# Patient Record
Sex: Female | Born: 1965 | Race: Black or African American | Hispanic: No | Marital: Single | Smoking: Current every day smoker
Health system: Southern US, Community
[De-identification: ages and names within clinical notes are randomized; demographics above are authoritative.]

## PROBLEM LIST (undated history)

## (undated) DIAGNOSIS — R519 Headache, unspecified: Secondary | ICD-10-CM

## (undated) DIAGNOSIS — K5080 Crohn's disease of both small and large intestine without complications: Secondary | ICD-10-CM

## (undated) DIAGNOSIS — K219 Gastro-esophageal reflux disease without esophagitis: Secondary | ICD-10-CM

## (undated) DIAGNOSIS — R51 Headache: Secondary | ICD-10-CM

## (undated) DIAGNOSIS — G8929 Other chronic pain: Secondary | ICD-10-CM

## (undated) DIAGNOSIS — M1990 Unspecified osteoarthritis, unspecified site: Secondary | ICD-10-CM

## (undated) HISTORY — DX: Gastro-esophageal reflux disease without esophagitis: K21.9

## (undated) HISTORY — PX: KNEE SURGERY: SHX244

## (undated) HISTORY — PX: TUBAL LIGATION: SHX77

## (undated) HISTORY — PX: HAND SURGERY: SHX662

## (undated) HISTORY — DX: Unspecified osteoarthritis, unspecified site: M19.90

## (undated) HISTORY — DX: Headache, unspecified: R51.9

## (undated) HISTORY — DX: Headache: R51

## (undated) HISTORY — DX: Crohn's disease of both small and large intestine without complications: K50.80

## (undated) HISTORY — DX: Other chronic pain: G89.29

---

## 1998-01-19 ENCOUNTER — Encounter: Admission: RE | Admit: 1998-01-19 | Discharge: 1998-01-19 | Payer: Self-pay | Admitting: Sports Medicine

## 1999-08-27 ENCOUNTER — Encounter: Admission: RE | Admit: 1999-08-27 | Discharge: 1999-08-27 | Payer: Self-pay | Admitting: Family Medicine

## 1999-09-03 ENCOUNTER — Encounter: Admission: RE | Admit: 1999-09-03 | Discharge: 1999-10-02 | Payer: Self-pay | Admitting: *Deleted

## 2001-03-03 ENCOUNTER — Emergency Department (HOSPITAL_COMMUNITY): Admission: EM | Admit: 2001-03-03 | Discharge: 2001-03-03 | Payer: Self-pay | Admitting: Emergency Medicine

## 2001-03-03 ENCOUNTER — Encounter: Payer: Self-pay | Admitting: Emergency Medicine

## 2001-03-11 ENCOUNTER — Ambulatory Visit (HOSPITAL_BASED_OUTPATIENT_CLINIC_OR_DEPARTMENT_OTHER): Admission: RE | Admit: 2001-03-11 | Discharge: 2001-03-11 | Payer: Self-pay | Admitting: Orthopedic Surgery

## 2001-09-28 ENCOUNTER — Emergency Department (HOSPITAL_COMMUNITY): Admission: EM | Admit: 2001-09-28 | Discharge: 2001-09-28 | Payer: Self-pay

## 2002-05-11 ENCOUNTER — Encounter: Admission: RE | Admit: 2002-05-11 | Discharge: 2002-05-11 | Payer: Self-pay | Admitting: *Deleted

## 2002-05-12 ENCOUNTER — Other Ambulatory Visit: Admission: RE | Admit: 2002-05-12 | Discharge: 2002-05-12 | Payer: Self-pay | Admitting: *Deleted

## 2002-05-25 ENCOUNTER — Encounter: Admission: RE | Admit: 2002-05-25 | Discharge: 2002-05-25 | Payer: Self-pay | Admitting: *Deleted

## 2002-06-08 ENCOUNTER — Encounter: Admission: RE | Admit: 2002-06-08 | Discharge: 2002-06-08 | Payer: Self-pay | Admitting: *Deleted

## 2005-06-22 ENCOUNTER — Encounter (INDEPENDENT_AMBULATORY_CARE_PROVIDER_SITE_OTHER): Payer: Self-pay | Admitting: *Deleted

## 2005-06-22 ENCOUNTER — Inpatient Hospital Stay (HOSPITAL_COMMUNITY): Admission: AD | Admit: 2005-06-22 | Discharge: 2005-06-22 | Payer: Self-pay | Admitting: Obstetrics and Gynecology

## 2005-06-24 ENCOUNTER — Emergency Department (HOSPITAL_COMMUNITY): Admission: EM | Admit: 2005-06-24 | Discharge: 2005-06-24 | Payer: Self-pay | Admitting: Emergency Medicine

## 2005-07-03 ENCOUNTER — Ambulatory Visit: Payer: Self-pay | Admitting: Internal Medicine

## 2005-07-12 ENCOUNTER — Ambulatory Visit: Payer: Self-pay | Admitting: Internal Medicine

## 2005-07-12 ENCOUNTER — Encounter (INDEPENDENT_AMBULATORY_CARE_PROVIDER_SITE_OTHER): Payer: Self-pay | Admitting: *Deleted

## 2005-07-12 HISTORY — PX: COLONOSCOPY: SHX5424

## 2005-07-19 ENCOUNTER — Ambulatory Visit: Payer: Self-pay | Admitting: Internal Medicine

## 2005-07-23 ENCOUNTER — Ambulatory Visit: Payer: Self-pay | Admitting: Internal Medicine

## 2005-09-04 ENCOUNTER — Ambulatory Visit: Payer: Self-pay | Admitting: Internal Medicine

## 2005-09-09 ENCOUNTER — Encounter: Payer: Self-pay | Admitting: Internal Medicine

## 2005-09-09 ENCOUNTER — Ambulatory Visit (HOSPITAL_COMMUNITY): Admission: RE | Admit: 2005-09-09 | Discharge: 2005-09-09 | Payer: Self-pay | Admitting: Internal Medicine

## 2005-09-17 ENCOUNTER — Ambulatory Visit: Payer: Self-pay | Admitting: Internal Medicine

## 2005-10-01 ENCOUNTER — Ambulatory Visit: Payer: Self-pay | Admitting: Internal Medicine

## 2005-10-31 ENCOUNTER — Ambulatory Visit: Payer: Self-pay | Admitting: Internal Medicine

## 2005-12-02 ENCOUNTER — Ambulatory Visit (HOSPITAL_COMMUNITY): Admission: RE | Admit: 2005-12-02 | Discharge: 2005-12-02 | Payer: Self-pay | Admitting: Internal Medicine

## 2005-12-02 ENCOUNTER — Encounter: Payer: Self-pay | Admitting: Internal Medicine

## 2005-12-10 ENCOUNTER — Ambulatory Visit: Payer: Self-pay | Admitting: Internal Medicine

## 2005-12-31 ENCOUNTER — Ambulatory Visit: Payer: Self-pay | Admitting: Internal Medicine

## 2006-01-02 ENCOUNTER — Ambulatory Visit (HOSPITAL_COMMUNITY): Admission: RE | Admit: 2006-01-02 | Discharge: 2006-01-02 | Payer: Self-pay | Admitting: Internal Medicine

## 2006-01-28 ENCOUNTER — Ambulatory Visit: Payer: Self-pay | Admitting: Internal Medicine

## 2006-03-11 ENCOUNTER — Ambulatory Visit: Payer: Self-pay | Admitting: Internal Medicine

## 2006-03-28 ENCOUNTER — Ambulatory Visit: Payer: Self-pay | Admitting: Internal Medicine

## 2006-07-08 DIAGNOSIS — M1990 Unspecified osteoarthritis, unspecified site: Secondary | ICD-10-CM | POA: Insufficient documentation

## 2006-08-04 ENCOUNTER — Ambulatory Visit: Payer: Self-pay | Admitting: Internal Medicine

## 2006-09-29 ENCOUNTER — Ambulatory Visit: Payer: Self-pay | Admitting: Internal Medicine

## 2006-10-06 ENCOUNTER — Ambulatory Visit: Payer: Self-pay | Admitting: Internal Medicine

## 2006-10-06 LAB — CONVERTED CEMR LAB
Albumin: 3.6 g/dL (ref 3.5–5.2)
Amylase: 154 units/L — ABNORMAL HIGH (ref 27–131)
Basophils Absolute: 0 10*3/uL (ref 0.0–0.1)
Bilirubin, Direct: 0.1 mg/dL (ref 0.0–0.3)
Eosinophils Absolute: 0.1 10*3/uL (ref 0.0–0.6)
HCT: 38.7 % (ref 36.0–46.0)
Hemoglobin: 13.4 g/dL (ref 12.0–15.0)
Lipase: 43 units/L (ref 11.0–59.0)
MCHC: 34.8 g/dL (ref 30.0–36.0)
MCV: 95.5 fL (ref 78.0–100.0)
Monocytes Absolute: 0.6 10*3/uL (ref 0.2–0.7)
Monocytes Relative: 8.5 % (ref 3.0–11.0)
Neutrophils Relative %: 50.8 % (ref 43.0–77.0)
RBC: 4.05 M/uL (ref 3.87–5.11)

## 2006-10-20 ENCOUNTER — Ambulatory Visit: Payer: Self-pay | Admitting: Internal Medicine

## 2006-10-20 LAB — CONVERTED CEMR LAB
ALT: 14 units/L (ref 0–40)
AST: 20 units/L (ref 0–37)
Basophils Absolute: 0 10*3/uL (ref 0.0–0.1)
Basophils Relative: 0.2 % (ref 0.0–1.0)
Bilirubin, Direct: 0.1 mg/dL (ref 0.0–0.3)
Eosinophils Relative: 0.9 % (ref 0.0–5.0)
HCT: 40 % (ref 36.0–46.0)
Neutrophils Relative %: 52.1 % (ref 43.0–77.0)
RBC: 4.15 M/uL (ref 3.87–5.11)
RDW: 13 % (ref 11.5–14.6)
Total Bilirubin: 0.6 mg/dL (ref 0.3–1.2)
Total Protein: 7.2 g/dL (ref 6.0–8.3)
WBC: 5.2 10*3/uL (ref 4.5–10.5)

## 2006-12-03 ENCOUNTER — Ambulatory Visit: Payer: Self-pay | Admitting: Internal Medicine

## 2006-12-03 LAB — CONVERTED CEMR LAB
Albumin: 3.9 g/dL (ref 3.5–5.2)
Alkaline Phosphatase: 44 units/L (ref 39–117)
Basophils Relative: 0.5 % (ref 0.0–1.0)
Lymphocytes Relative: 44.3 % (ref 12.0–46.0)
Monocytes Relative: 6.3 % (ref 3.0–11.0)
Neutro Abs: 1.9 10*3/uL (ref 1.4–7.7)
Platelets: 154 10*3/uL (ref 150–400)
Total Bilirubin: 0.7 mg/dL (ref 0.3–1.2)

## 2007-05-05 ENCOUNTER — Ambulatory Visit: Payer: Self-pay | Admitting: Internal Medicine

## 2007-05-05 LAB — CONVERTED CEMR LAB
Basophils Absolute: 0 10*3/uL (ref 0.0–0.1)
Chloride: 105 meq/L (ref 96–112)
Creatinine, Ser: 0.8 mg/dL (ref 0.4–1.2)
Eosinophils Absolute: 0.1 10*3/uL (ref 0.0–0.6)
Glucose, Bld: 100 mg/dL — ABNORMAL HIGH (ref 70–99)
HCT: 38.8 % (ref 36.0–46.0)
Hemoglobin: 13.4 g/dL (ref 12.0–15.0)
MCHC: 34.5 g/dL (ref 30.0–36.0)
MCV: 96.2 fL (ref 78.0–100.0)
Monocytes Absolute: 0.4 10*3/uL (ref 0.2–0.7)
Neutro Abs: 2.2 10*3/uL (ref 1.4–7.7)
Neutrophils Relative %: 42.6 % — ABNORMAL LOW (ref 43.0–77.0)
RBC: 4.03 M/uL (ref 3.87–5.11)
Sodium: 140 meq/L (ref 135–145)

## 2007-05-06 ENCOUNTER — Ambulatory Visit: Payer: Self-pay | Admitting: Internal Medicine

## 2007-06-19 ENCOUNTER — Ambulatory Visit: Payer: Self-pay | Admitting: Internal Medicine

## 2007-06-19 LAB — CONVERTED CEMR LAB: Vit D, 1,25-Dihydroxy: 12 — ABNORMAL LOW (ref 30–89)

## 2007-06-26 ENCOUNTER — Ambulatory Visit: Payer: Self-pay | Admitting: Internal Medicine

## 2007-06-26 HISTORY — PX: OTHER SURGICAL HISTORY: SHX169

## 2007-08-03 ENCOUNTER — Ambulatory Visit: Payer: Self-pay | Admitting: Internal Medicine

## 2007-08-03 LAB — CONVERTED CEMR LAB
AST: 17 units/L (ref 0–37)
Alkaline Phosphatase: 46 units/L (ref 39–117)
Basophils Relative: 0.3 % (ref 0.0–1.0)
Eosinophils Relative: 0.3 % (ref 0.0–5.0)
Hemoglobin: 13.4 g/dL (ref 12.0–15.0)
Lymphocytes Relative: 42.9 % (ref 12.0–46.0)
Monocytes Absolute: 0.3 10*3/uL (ref 0.2–0.7)
Neutro Abs: 2.7 10*3/uL (ref 1.4–7.7)
Platelets: 181 10*3/uL (ref 150–400)
RDW: 13.5 % (ref 11.5–14.6)
Total Bilirubin: 0.7 mg/dL (ref 0.3–1.2)
Total Protein: 7.3 g/dL (ref 6.0–8.3)
WBC: 5.2 10*3/uL (ref 4.5–10.5)

## 2007-08-17 ENCOUNTER — Ambulatory Visit: Payer: Self-pay | Admitting: Internal Medicine

## 2007-08-17 DIAGNOSIS — K219 Gastro-esophageal reflux disease without esophagitis: Secondary | ICD-10-CM

## 2007-08-18 ENCOUNTER — Encounter: Payer: Self-pay | Admitting: Internal Medicine

## 2007-09-01 ENCOUNTER — Ambulatory Visit: Payer: Self-pay | Admitting: Internal Medicine

## 2007-09-01 LAB — CONVERTED CEMR LAB
ALT: 13 units/L (ref 0–35)
AST: 15 units/L (ref 0–37)
Albumin: 3.6 g/dL (ref 3.5–5.2)
Alkaline Phosphatase: 42 units/L (ref 39–117)
Basophils Absolute: 0 10*3/uL (ref 0.0–0.1)
Bilirubin, Direct: 0.1 mg/dL (ref 0.0–0.3)
Eosinophils Absolute: 0 10*3/uL (ref 0.0–0.6)
HCT: 37.5 % (ref 36.0–46.0)
MCHC: 33.2 g/dL (ref 30.0–36.0)
Neutrophils Relative %: 45 % (ref 43.0–77.0)
Platelets: 156 10*3/uL (ref 150–400)
RBC: 3.88 M/uL (ref 3.87–5.11)
RDW: 13 % (ref 11.5–14.6)

## 2007-10-12 ENCOUNTER — Ambulatory Visit: Payer: Self-pay | Admitting: Internal Medicine

## 2007-10-12 LAB — CONVERTED CEMR LAB
AST: 23 units/L (ref 0–37)
Albumin: 3.9 g/dL (ref 3.5–5.2)
Basophils Absolute: 0 10*3/uL (ref 0.0–0.1)
Bilirubin, Direct: 0.1 mg/dL (ref 0.0–0.3)
Eosinophils Absolute: 0.1 10*3/uL (ref 0.0–0.6)
HCT: 39.2 % (ref 36.0–46.0)
Hemoglobin: 12.9 g/dL (ref 12.0–15.0)
Lymphocytes Relative: 43.5 % (ref 12.0–46.0)
MCHC: 32.9 g/dL (ref 30.0–36.0)
MCV: 97.6 fL (ref 78.0–100.0)
Monocytes Absolute: 0.4 10*3/uL (ref 0.2–0.7)
Monocytes Relative: 7.4 % (ref 3.0–11.0)
Neutro Abs: 2.3 10*3/uL (ref 1.4–7.7)
Neutrophils Relative %: 46.8 % (ref 43.0–77.0)

## 2008-01-06 ENCOUNTER — Ambulatory Visit: Payer: Self-pay | Admitting: Internal Medicine

## 2008-01-06 LAB — CONVERTED CEMR LAB
AST: 21 units/L (ref 0–37)
Albumin: 4.2 g/dL (ref 3.5–5.2)
BUN: 10 mg/dL (ref 6–23)
Basophils Relative: 1.5 % — ABNORMAL HIGH (ref 0.0–1.0)
Calcium: 8.9 mg/dL (ref 8.4–10.5)
Chloride: 112 meq/L (ref 96–112)
Creatinine, Ser: 0.8 mg/dL (ref 0.4–1.2)
Eosinophils Absolute: 0 10*3/uL (ref 0.0–0.7)
Eosinophils Relative: 0.5 % (ref 0.0–5.0)
GFR calc non Af Amer: 85 mL/min
Glucose, Bld: 103 mg/dL — ABNORMAL HIGH (ref 70–99)
HCT: 39.2 % (ref 36.0–46.0)
MCV: 97.7 fL (ref 78.0–100.0)
Monocytes Absolute: 0.3 10*3/uL (ref 0.1–1.0)
Monocytes Relative: 6.5 % (ref 3.0–12.0)
Neutrophils Relative %: 58.6 % (ref 43.0–77.0)
Platelets: 159 10*3/uL (ref 150–400)
Potassium: 4.3 meq/L (ref 3.5–5.1)
RBC: 4.02 M/uL (ref 3.87–5.11)
WBC: 4.8 10*3/uL (ref 4.5–10.5)

## 2008-01-14 ENCOUNTER — Ambulatory Visit: Payer: Self-pay | Admitting: Internal Medicine

## 2008-02-02 ENCOUNTER — Encounter (INDEPENDENT_AMBULATORY_CARE_PROVIDER_SITE_OTHER): Payer: Self-pay | Admitting: *Deleted

## 2008-02-02 ENCOUNTER — Ambulatory Visit: Payer: Self-pay | Admitting: Internal Medicine

## 2008-02-02 LAB — CONVERTED CEMR LAB

## 2008-02-08 ENCOUNTER — Ambulatory Visit: Payer: Self-pay | Admitting: Internal Medicine

## 2008-02-08 LAB — CONVERTED CEMR LAB
Chlamydia, DNA Probe: NEGATIVE
GC Probe Amp, Genital: NEGATIVE

## 2008-02-09 LAB — CONVERTED CEMR LAB
AST: 20 units/L (ref 0–37)
Albumin: 4 g/dL (ref 3.5–5.2)
Alkaline Phosphatase: 42 units/L (ref 39–117)
Basophils Absolute: 0 10*3/uL (ref 0.0–0.1)
Bilirubin, Direct: 0.1 mg/dL (ref 0.0–0.3)
Eosinophils Absolute: 0 10*3/uL (ref 0.0–0.7)
Lymphocytes Relative: 50.4 % — ABNORMAL HIGH (ref 12.0–46.0)
MCHC: 33.9 g/dL (ref 30.0–36.0)
MCV: 96.9 fL (ref 78.0–100.0)
Neutrophils Relative %: 40.7 % — ABNORMAL LOW (ref 43.0–77.0)
Platelets: 172 10*3/uL (ref 150–400)
RDW: 13 % (ref 11.5–14.6)
Total Bilirubin: 0.7 mg/dL (ref 0.3–1.2)

## 2008-03-29 ENCOUNTER — Telehealth: Payer: Self-pay | Admitting: Internal Medicine

## 2008-05-09 ENCOUNTER — Ambulatory Visit: Payer: Self-pay | Admitting: Internal Medicine

## 2008-05-10 ENCOUNTER — Encounter: Payer: Self-pay | Admitting: Internal Medicine

## 2008-05-10 LAB — CONVERTED CEMR LAB
ALT: 12 units/L (ref 0–35)
Eosinophils Absolute: 0.1 10*3/uL (ref 0.0–0.7)
HCT: 37.6 % (ref 36.0–46.0)
Hemoglobin: 13 g/dL (ref 12.0–15.0)
MCHC: 34.6 g/dL (ref 30.0–36.0)
MCV: 98.5 fL (ref 78.0–100.0)
Monocytes Absolute: 0.2 10*3/uL (ref 0.1–1.0)
Monocytes Relative: 4 % (ref 3.0–12.0)
Neutro Abs: 3.5 10*3/uL (ref 1.4–7.7)
RDW: 13.2 % (ref 11.5–14.6)
Total Bilirubin: 0.6 mg/dL (ref 0.3–1.2)

## 2008-08-11 ENCOUNTER — Ambulatory Visit: Payer: Self-pay | Admitting: Internal Medicine

## 2008-08-11 LAB — CONVERTED CEMR LAB
AST: 25 units/L (ref 0–37)
Albumin: 4.2 g/dL (ref 3.5–5.2)
Alkaline Phosphatase: 51 units/L (ref 39–117)
Basophils Relative: 0.3 % (ref 0.0–3.0)
Eosinophils Relative: 0.8 % (ref 0.0–5.0)
Hemoglobin: 13.9 g/dL (ref 12.0–15.0)
Lymphocytes Relative: 37.4 % (ref 12.0–46.0)
MCV: 99.6 fL (ref 78.0–100.0)
Neutrophils Relative %: 52.6 % (ref 43.0–77.0)
RBC: 4.02 M/uL (ref 3.87–5.11)
Total Protein: 8.1 g/dL (ref 6.0–8.3)
WBC: 4 10*3/uL — ABNORMAL LOW (ref 4.5–10.5)

## 2008-08-17 ENCOUNTER — Telehealth (INDEPENDENT_AMBULATORY_CARE_PROVIDER_SITE_OTHER): Payer: Self-pay | Admitting: *Deleted

## 2008-08-19 ENCOUNTER — Telehealth: Payer: Self-pay | Admitting: Internal Medicine

## 2008-08-19 ENCOUNTER — Ambulatory Visit: Payer: Self-pay | Admitting: Internal Medicine

## 2008-08-19 ENCOUNTER — Encounter (INDEPENDENT_AMBULATORY_CARE_PROVIDER_SITE_OTHER): Payer: Self-pay | Admitting: *Deleted

## 2008-08-19 DIAGNOSIS — N920 Excessive and frequent menstruation with regular cycle: Secondary | ICD-10-CM

## 2008-08-19 LAB — CONVERTED CEMR LAB
Platelets: 181 10*3/uL (ref 150–400)
Preg, Serum: NEGATIVE
RDW: 13.9 % (ref 11.5–15.5)
TSH: 2.44 microintl units/mL (ref 0.350–4.50)

## 2008-08-23 ENCOUNTER — Ambulatory Visit (HOSPITAL_COMMUNITY): Admission: RE | Admit: 2008-08-23 | Discharge: 2008-08-23 | Payer: Self-pay | Admitting: *Deleted

## 2008-09-26 ENCOUNTER — Telehealth: Payer: Self-pay | Admitting: Internal Medicine

## 2008-10-18 ENCOUNTER — Ambulatory Visit: Payer: Self-pay | Admitting: Internal Medicine

## 2008-10-18 ENCOUNTER — Telehealth: Payer: Self-pay | Admitting: Internal Medicine

## 2008-10-18 LAB — CONVERTED CEMR LAB
Albumin: 4.2 g/dL (ref 3.5–5.2)
Alkaline Phosphatase: 56 units/L (ref 39–117)
CO2: 26 meq/L (ref 19–32)
Eosinophils Relative: 0.2 % (ref 0.0–5.0)
Glucose, Bld: 114 mg/dL — ABNORMAL HIGH (ref 70–99)
Monocytes Absolute: 0.5 10*3/uL (ref 0.1–1.0)
Monocytes Relative: 9.3 % (ref 3.0–12.0)
Neutrophils Relative %: 68.8 % (ref 43.0–77.0)
Platelets: 163 10*3/uL (ref 150.0–400.0)
Potassium: 4.6 meq/L (ref 3.5–5.1)
Sodium: 140 meq/L (ref 135–145)
Total Protein: 7.6 g/dL (ref 6.0–8.3)
WBC: 5.8 10*3/uL (ref 4.5–10.5)

## 2008-10-19 ENCOUNTER — Ambulatory Visit: Payer: Self-pay | Admitting: Internal Medicine

## 2008-10-20 ENCOUNTER — Encounter: Payer: Self-pay | Admitting: Nurse Practitioner

## 2008-11-16 ENCOUNTER — Telehealth: Payer: Self-pay | Admitting: Internal Medicine

## 2008-11-21 ENCOUNTER — Ambulatory Visit: Payer: Self-pay | Admitting: Internal Medicine

## 2008-11-28 ENCOUNTER — Telehealth: Payer: Self-pay | Admitting: Internal Medicine

## 2009-01-05 ENCOUNTER — Encounter (INDEPENDENT_AMBULATORY_CARE_PROVIDER_SITE_OTHER): Payer: Self-pay | Admitting: *Deleted

## 2009-03-02 ENCOUNTER — Ambulatory Visit: Payer: Self-pay | Admitting: Obstetrics & Gynecology

## 2009-03-02 ENCOUNTER — Ambulatory Visit: Payer: Self-pay | Admitting: Internal Medicine

## 2009-03-02 ENCOUNTER — Encounter (INDEPENDENT_AMBULATORY_CARE_PROVIDER_SITE_OTHER): Payer: Self-pay | Admitting: Obstetrics & Gynecology

## 2009-03-06 LAB — CONVERTED CEMR LAB
ALT: 13 units/L (ref 0–35)
Albumin: 4.2 g/dL (ref 3.5–5.2)
Basophils Relative: 0.7 % (ref 0.0–3.0)
Eosinophils Relative: 1.1 % (ref 0.0–5.0)
Lymphocytes Relative: 40.2 % (ref 12.0–46.0)
MCV: 97.5 fL (ref 78.0–100.0)
Monocytes Relative: 8.3 % (ref 3.0–12.0)
Neutrophils Relative %: 49.7 % (ref 43.0–77.0)
RBC: 4.02 M/uL (ref 3.87–5.11)
Total Bilirubin: 0.7 mg/dL (ref 0.3–1.2)
WBC: 4.3 10*3/uL — ABNORMAL LOW (ref 4.5–10.5)

## 2009-03-13 ENCOUNTER — Telehealth: Payer: Self-pay | Admitting: Internal Medicine

## 2009-06-05 ENCOUNTER — Telehealth: Payer: Self-pay | Admitting: Internal Medicine

## 2009-06-06 ENCOUNTER — Ambulatory Visit: Payer: Self-pay | Admitting: Internal Medicine

## 2009-06-08 LAB — CONVERTED CEMR LAB
ALT: 14 units/L (ref 0–35)
Albumin: 3.9 g/dL (ref 3.5–5.2)
Basophils Relative: 0.6 % (ref 0.0–3.0)
Eosinophils Absolute: 0 10*3/uL (ref 0.0–0.7)
Eosinophils Relative: 1.3 % (ref 0.0–5.0)
HCT: 36.1 % (ref 36.0–46.0)
Hemoglobin: 12.6 g/dL (ref 12.0–15.0)
MCHC: 35 g/dL (ref 30.0–36.0)
MCV: 101.7 fL — ABNORMAL HIGH (ref 78.0–100.0)
Monocytes Absolute: 0.2 10*3/uL (ref 0.1–1.0)
Neutro Abs: 2.1 10*3/uL (ref 1.4–7.7)
RBC: 3.55 M/uL — ABNORMAL LOW (ref 3.87–5.11)
Total Protein: 6.8 g/dL (ref 6.0–8.3)

## 2009-08-10 ENCOUNTER — Encounter: Payer: Self-pay | Admitting: Internal Medicine

## 2009-08-10 ENCOUNTER — Ambulatory Visit: Payer: Self-pay | Admitting: Internal Medicine

## 2009-08-21 ENCOUNTER — Ambulatory Visit: Payer: Self-pay | Admitting: Internal Medicine

## 2009-08-21 DIAGNOSIS — K5080 Crohn's disease of both small and large intestine without complications: Secondary | ICD-10-CM | POA: Insufficient documentation

## 2009-10-24 ENCOUNTER — Ambulatory Visit: Payer: Self-pay | Admitting: Internal Medicine

## 2009-11-09 ENCOUNTER — Ambulatory Visit: Payer: Self-pay | Admitting: Internal Medicine

## 2009-11-10 LAB — CONVERTED CEMR LAB
Basophils Absolute: 0 10*3/uL (ref 0.0–0.1)
Eosinophils Absolute: 0 10*3/uL (ref 0.0–0.7)
HCT: 40 % (ref 36.0–46.0)
Hemoglobin: 13.6 g/dL (ref 12.0–15.0)
Lymphs Abs: 2 10*3/uL (ref 0.7–4.0)
MCHC: 34.1 g/dL (ref 30.0–36.0)
Monocytes Absolute: 0.3 10*3/uL (ref 0.1–1.0)
Monocytes Relative: 7.1 % (ref 3.0–12.0)
Neutro Abs: 1.7 10*3/uL (ref 1.4–7.7)
Platelets: 147 10*3/uL — ABNORMAL LOW (ref 150.0–400.0)
RDW: 13.8 % (ref 11.5–14.6)

## 2009-12-22 ENCOUNTER — Encounter (INDEPENDENT_AMBULATORY_CARE_PROVIDER_SITE_OTHER): Payer: Self-pay | Admitting: *Deleted

## 2010-01-10 ENCOUNTER — Encounter (INDEPENDENT_AMBULATORY_CARE_PROVIDER_SITE_OTHER): Payer: Self-pay | Admitting: *Deleted

## 2010-01-29 LAB — CONVERTED CEMR LAB
AST: 18 units/L (ref 0–37)
Albumin: 3.7 g/dL (ref 3.5–5.2)
Alkaline Phosphatase: 42 units/L (ref 39–117)
Basophils Absolute: 0 10*3/uL (ref 0.0–0.1)
Basophils Relative: 1 % (ref 0.0–3.0)
Bilirubin, Direct: 0.1 mg/dL (ref 0.0–0.3)
Hemoglobin: 12.3 g/dL (ref 12.0–15.0)
Lymphocytes Relative: 34.3 % (ref 12.0–46.0)
Monocytes Relative: 7 % (ref 3.0–12.0)
Neutro Abs: 2.1 10*3/uL (ref 1.4–7.7)
Neutrophils Relative %: 57.3 % (ref 43.0–77.0)
RBC: 3.72 M/uL — ABNORMAL LOW (ref 3.87–5.11)
RDW: 12.9 % (ref 11.5–14.6)

## 2010-02-01 ENCOUNTER — Ambulatory Visit: Payer: Self-pay | Admitting: Internal Medicine

## 2010-02-01 DIAGNOSIS — R519 Headache, unspecified: Secondary | ICD-10-CM | POA: Insufficient documentation

## 2010-02-01 DIAGNOSIS — R51 Headache: Secondary | ICD-10-CM | POA: Insufficient documentation

## 2010-03-20 IMAGING — US US PELVIS COMPLETE MODIFY
1 series · 14 of 25 positions shown · non-contrast
Comparison: CT examination 06/22/2005.

CLINICAL DATA: History given of prolonged menstrual period.
History given of previous tubal ligation procedures.  History given
of Crohn's disease. The patient's last menstrual period was dated
08/03/2008.

TRANSABDOMINAL AND TRANSVAGINAL ULTRASOUND OF PELVIS
TECHNIQUE: Both transabdominal and transvaginal ultrasound
examinations of the pelvis were performed including evaluation of
the uterus, ovaries, adnexal regions, and pelvic cul-de-sac.

[Series 1: unknown · 0.23mm/px · 14 of 47 slices shown]
[im 1/47]
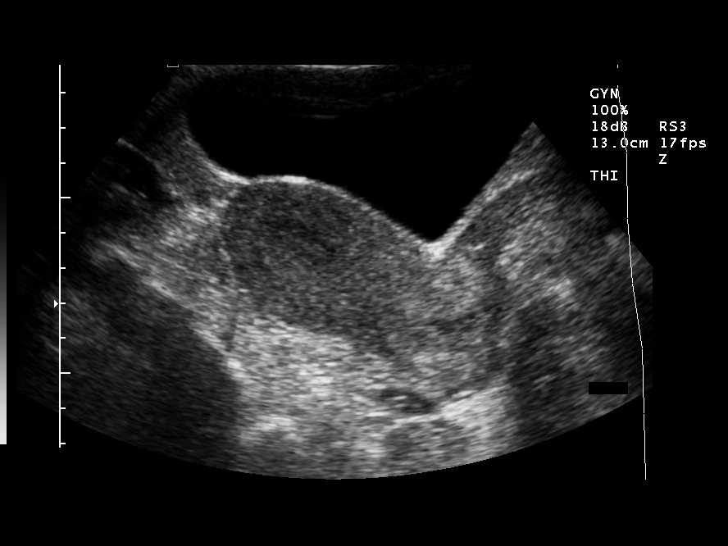
[im 4/47]
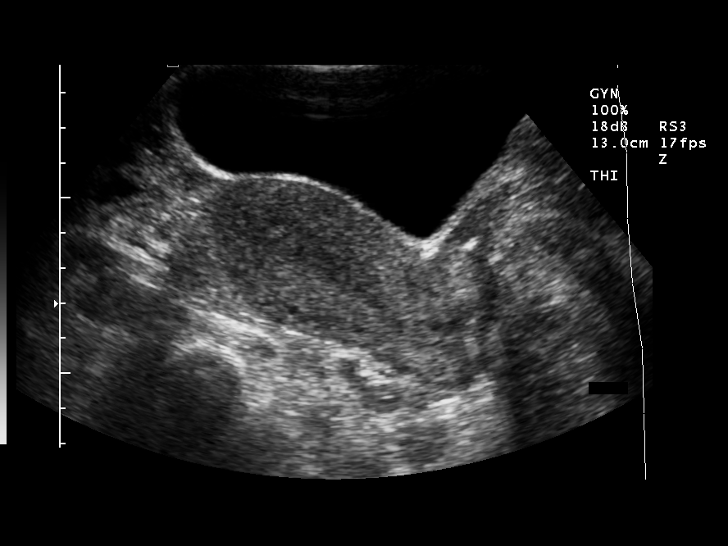
[im 8/47]
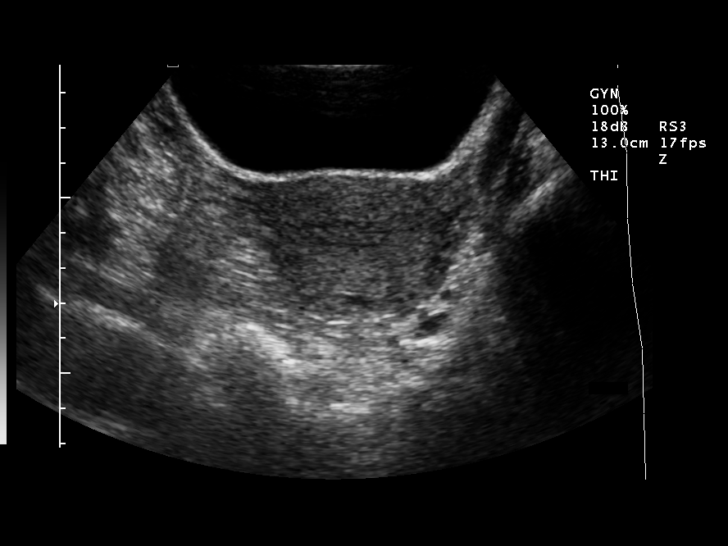
[im 12/47]
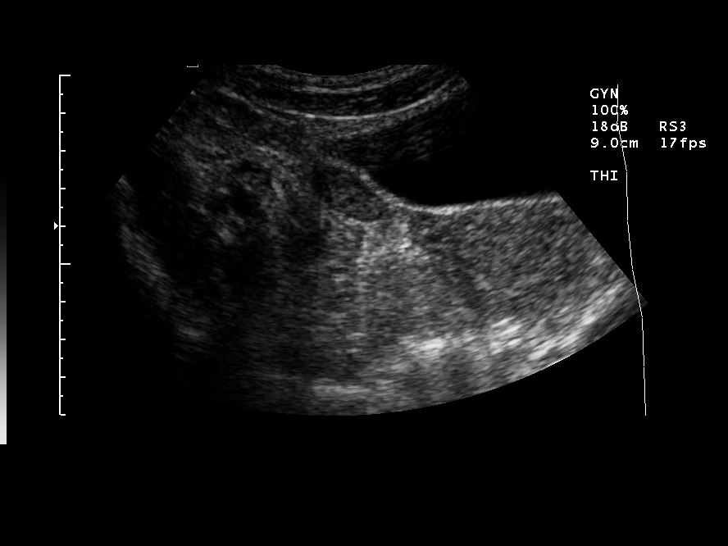
[im 16/47]
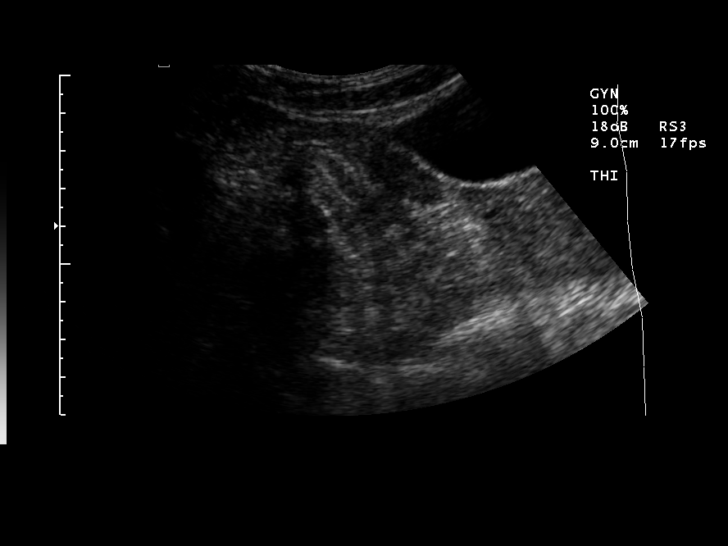
[im 18/47]
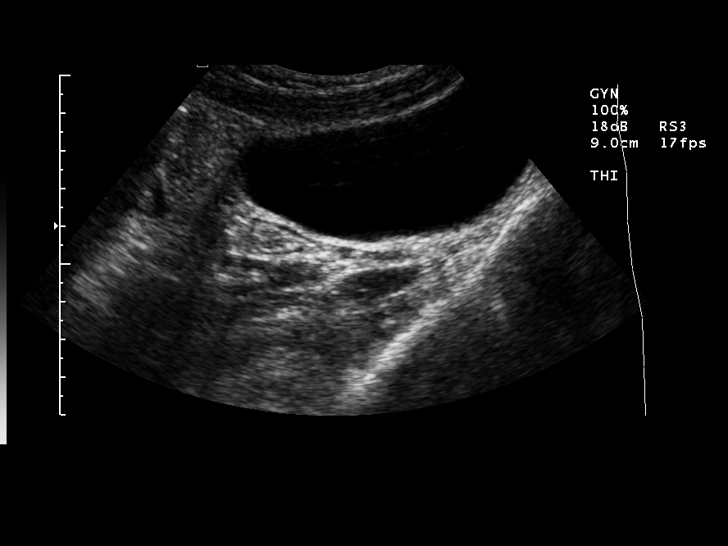
[im 22/47]
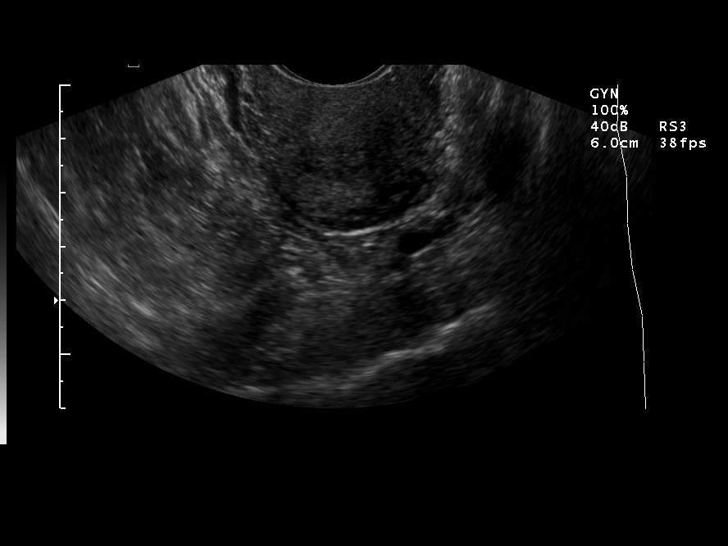
[im 25/47]
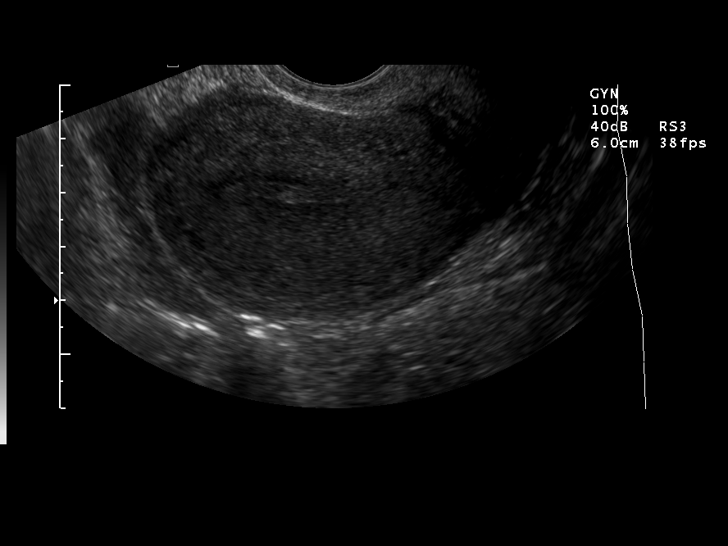
[im 29/47]
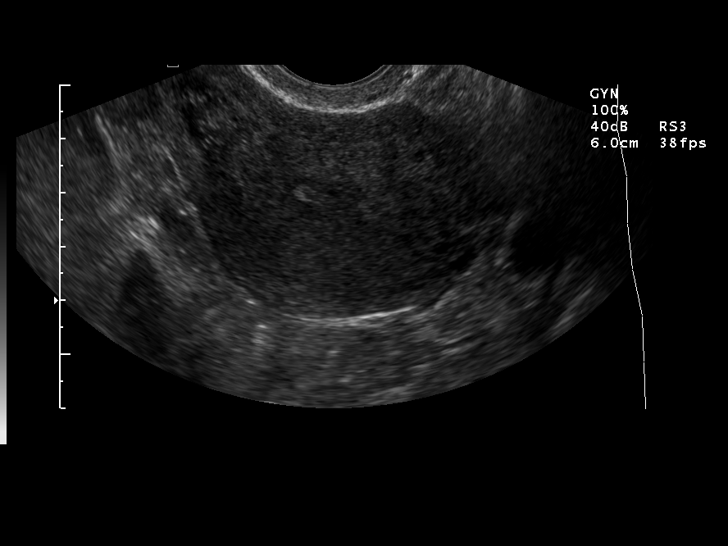
[im 31/47]
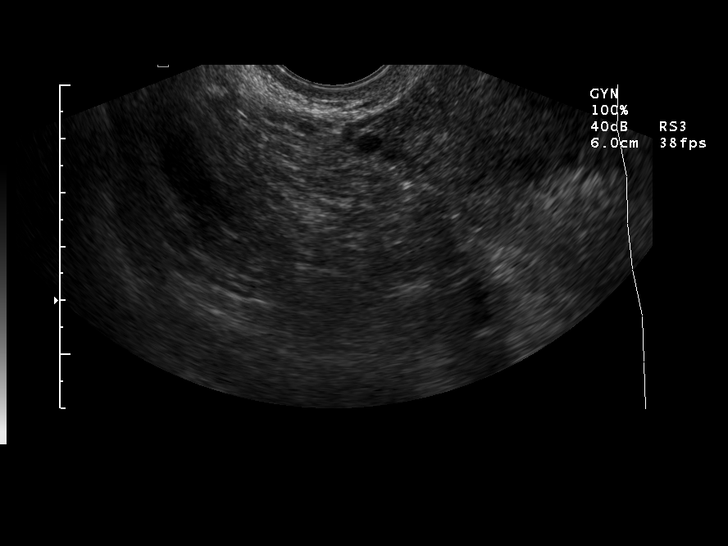
[im 35/47]
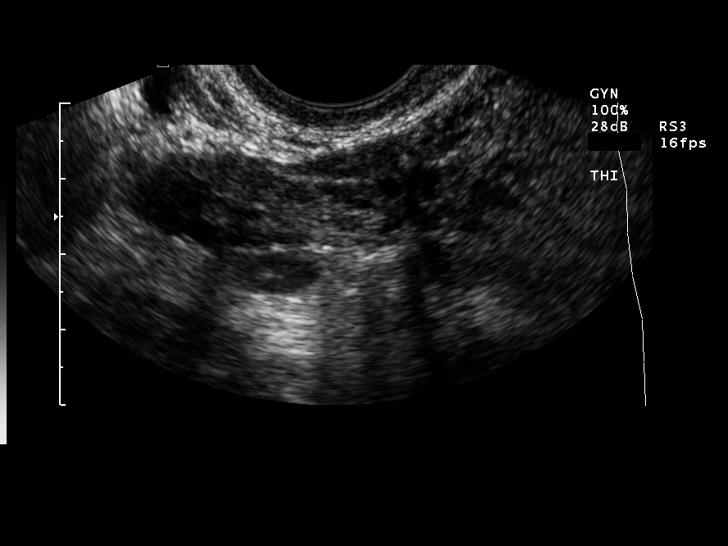
[im 39/47]
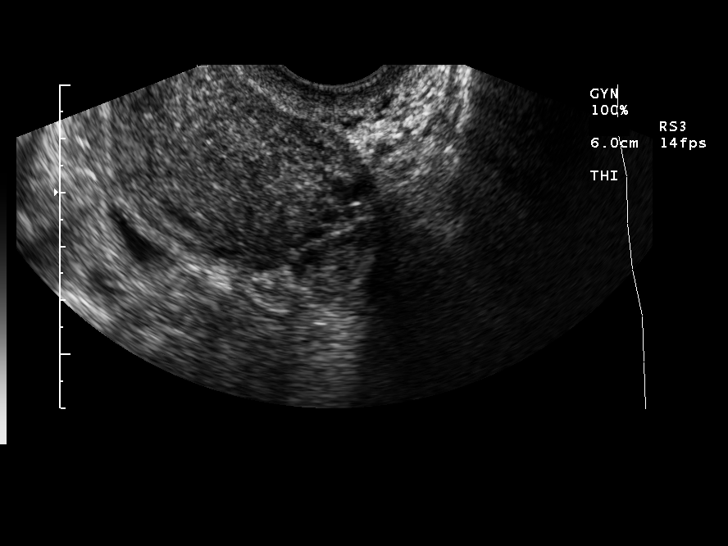
[im 43/47]
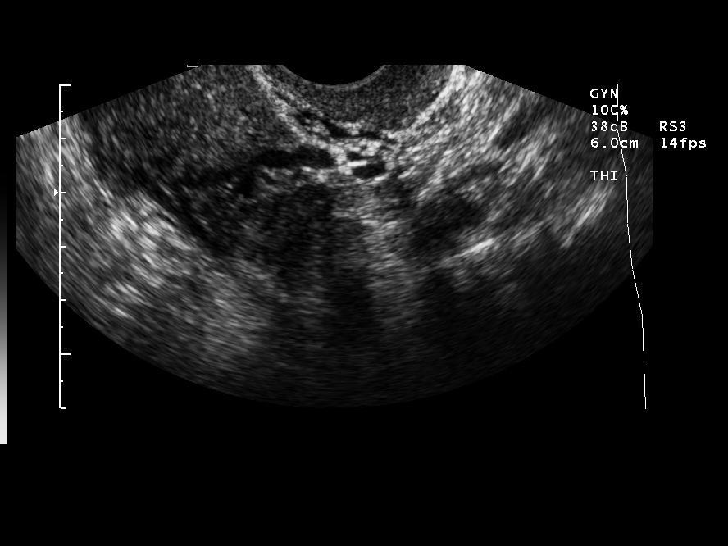
[im 47/47]
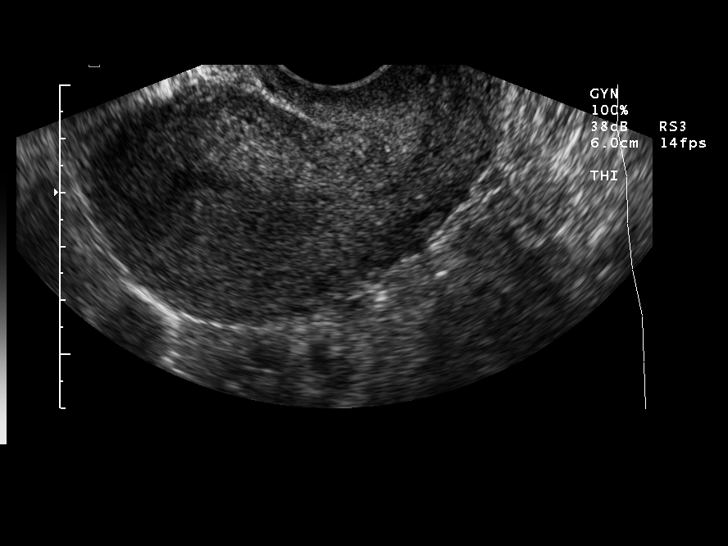

[14 of 25 positions shown; findings below may reference images not displayed]

FINDINGS: The uterus measures 9.0 x 3.9 x 5.5 cm.  Myometrium
appears normal.

Endometrial stripe thickness is 0.57 cm. No endometrial mass or
fluid collection is identified.

No free fluid is seen within the pelvis.

Right ovary measures 2.5 x 1.2 x 2.9 cm.

Left ovary measures 2.7 x 1.7 x 1.1 cm.

No ovarian lesions are identified.
IMPRESSION: No pelvic pathology is identified.

## 2010-05-09 LAB — CONVERTED CEMR LAB
AST: 19 units/L (ref 0–37)
Albumin: 4.1 g/dL (ref 3.5–5.2)
Basophils Absolute: 0 10*3/uL (ref 0.0–0.1)
Basophils Relative: 0.5 % (ref 0.0–3.0)
Eosinophils Absolute: 0 10*3/uL (ref 0.0–0.7)
Eosinophils Relative: 0.6 % (ref 0.0–5.0)
HCT: 36.8 % (ref 36.0–46.0)
Hemoglobin: 12.6 g/dL (ref 12.0–15.0)
MCV: 99.6 fL (ref 78.0–100.0)
Monocytes Relative: 5.6 % (ref 3.0–12.0)
Platelets: 148 10*3/uL — ABNORMAL LOW (ref 150.0–400.0)
RBC: 3.69 M/uL — ABNORMAL LOW (ref 3.87–5.11)
Total Bilirubin: 0.3 mg/dL (ref 0.3–1.2)
WBC: 5.4 10*3/uL (ref 4.5–10.5)

## 2010-05-10 ENCOUNTER — Ambulatory Visit: Payer: Self-pay | Admitting: Internal Medicine

## 2010-05-11 LAB — CONVERTED CEMR LAB
AST: 23 units/L (ref 0–37)
Alkaline Phosphatase: 53 units/L (ref 39–117)
Basophils Absolute: 0 10*3/uL (ref 0.0–0.1)
Basophils Relative: 0.4 % (ref 0.0–3.0)
Bilirubin, Direct: 0 mg/dL (ref 0.0–0.3)
Eosinophils Absolute: 0 10*3/uL (ref 0.0–0.7)
Hemoglobin: 12.6 g/dL (ref 12.0–15.0)
Lymphocytes Relative: 28.4 % (ref 12.0–46.0)
MCHC: 33.9 g/dL (ref 30.0–36.0)
Monocytes Relative: 5.7 % (ref 3.0–12.0)
Neutro Abs: 3.2 10*3/uL (ref 1.4–7.7)
Neutrophils Relative %: 64.6 % (ref 43.0–77.0)
RBC: 3.74 M/uL — ABNORMAL LOW (ref 3.87–5.11)
Total Protein: 6.7 g/dL (ref 6.0–8.3)

## 2010-06-18 ENCOUNTER — Ambulatory Visit (HOSPITAL_COMMUNITY): Admission: RE | Admit: 2010-06-18 | Discharge: 2010-06-18 | Payer: Self-pay | Admitting: Family Medicine

## 2010-06-18 ENCOUNTER — Encounter (INDEPENDENT_AMBULATORY_CARE_PROVIDER_SITE_OTHER): Payer: Self-pay | Admitting: *Deleted

## 2010-06-18 ENCOUNTER — Ambulatory Visit: Payer: Self-pay | Admitting: Obstetrics and Gynecology

## 2010-06-18 LAB — CONVERTED CEMR LAB
HCV Ab: NEGATIVE
HIV: NONREACTIVE

## 2010-07-02 ENCOUNTER — Encounter: Admission: RE | Admit: 2010-07-02 | Discharge: 2010-07-02 | Payer: Self-pay | Admitting: Family Medicine

## 2010-08-09 ENCOUNTER — Encounter: Payer: Self-pay | Admitting: Internal Medicine

## 2010-08-09 ENCOUNTER — Other Ambulatory Visit: Payer: Self-pay | Admitting: Internal Medicine

## 2010-08-09 ENCOUNTER — Ambulatory Visit
Admission: RE | Admit: 2010-08-09 | Discharge: 2010-08-09 | Payer: Self-pay | Source: Home / Self Care | Attending: Internal Medicine | Admitting: Internal Medicine

## 2010-08-09 LAB — CBC WITH DIFFERENTIAL/PLATELET
Basophils Absolute: 0 10*3/uL (ref 0.0–0.1)
Basophils Relative: 0.6 % (ref 0.0–3.0)
Eosinophils Absolute: 0.1 10*3/uL (ref 0.0–0.7)
Eosinophils Relative: 1.6 % (ref 0.0–5.0)
HCT: 40 % (ref 36.0–46.0)
Hemoglobin: 13.6 g/dL (ref 12.0–15.0)
Lymphocytes Relative: 32.5 % (ref 12.0–46.0)
Lymphs Abs: 1.5 10*3/uL (ref 0.7–4.0)
MCHC: 34 g/dL (ref 30.0–36.0)
MCV: 98.7 fl (ref 78.0–100.0)
Monocytes Absolute: 0.3 10*3/uL (ref 0.1–1.0)
Monocytes Relative: 6.7 % (ref 3.0–12.0)
Neutro Abs: 2.7 10*3/uL (ref 1.4–7.7)
Neutrophils Relative %: 58.6 % (ref 43.0–77.0)
Platelets: 164 10*3/uL (ref 150.0–400.0)
RBC: 4.05 Mil/uL (ref 3.87–5.11)
RDW: 14.3 % (ref 11.5–14.6)
WBC: 4.6 10*3/uL (ref 4.5–10.5)

## 2010-08-09 LAB — HEPATIC FUNCTION PANEL
ALT: 21 U/L (ref 0–35)
AST: 29 U/L (ref 0–37)
Albumin: 4.2 g/dL (ref 3.5–5.2)
Alkaline Phosphatase: 53 U/L (ref 39–117)
Bilirubin, Direct: 0.1 mg/dL (ref 0.0–0.3)
Total Bilirubin: 0.5 mg/dL (ref 0.3–1.2)
Total Protein: 7.6 g/dL (ref 6.0–8.3)

## 2010-09-04 NOTE — Assessment & Plan Note (Signed)
Summary: f/u--ch.   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 NO GI problems  Having headaches and using Advil 2-4/day sometimes. Off and on x 2-3 monhs. She feels stressed looking for a job and considers that part of the problem. Pain is bitemporal and more frequent than in past. Denies vision changes, focal weakness, neurologic changes.  No fevers, chills, skin rash. Keeping GYN appointments. Due in next 1-2 months [facility]).   GI Review of Systems      Denies abdominal pain, acid reflux, belching, bloating, chest pain, dysphagia with liquids, dysphagia with solids, heartburn, loss of appetite, nausea, vomiting, vomiting blood, weight loss, and  weight gain.        Denies anal fissure, black tarry stools, change in bowel habit, constipation, diarrhea, diverticulosis, fecal incontinence, heme positive stool, hemorrhoids, irritable bowel syndrome, jaundice, light color stool, liver problems, rectal bleeding, and  rectal pain.    Current Medications (verified): 1)  Omeprazole 20 Mg Cpdr (Omeprazole) .... Take 1 Tab Daily 2)  Azathioprine 50 Mg  Tabs (Azathioprine) .... Take 1 1/2 Tablets By Mouth Once Daily 3)  Vitamin D 1000 Unit  Tabs (Cholecalciferol) .Marland Kitchen.. 1 Once Daily  Allergies (verified): 1)  ! * Live Vaccines  Past History:  Past Medical History: Reviewed history from 08/19/2008 and no changes required. OSTEOARTHRITIS    - hx of injury in L knee with intermittent joint effusion CROHN'S DISEASE    - Ileitis, failed Asacol, on Azathioprine (Intermediate Metabolizer)    - dx'd in 2006    - followed by Dr. Leone Payor Hypovitaminosis D (treated) GERD (clinical dx)  Past Surgical History: Reviewed history from  08/19/2008 and no changes required. s/p tubal ligation Left Knee Surgery Right Hand Surgery  Family History: Reviewed history from 08/21/2009 and no changes required. No hx of IBD No family hx of breast or colon CA Mother: HTN 1 sister/2brothers: 1 brother with HTN No FH of Colon Cancer:  Social History: Reviewed history from 08/21/2009 and no changes required. Patient restarted smoking, counselled to quit. 3 ciggs per day Single, unemployed laid off 1/11 Two dgtrs. No current boyfriends. Medicaid Alcohol Use - yes-on occasion Daily Caffeine Use-5 cups daily Illicit Drug Use - no Patient gets regular exercise.  Vital Signs:  Patient profile:   45 year old female Height:      64 inches Weight:      110.25 pounds BMI:     18.99 Pulse rate:   72 / minute Pulse rhythm:   regular BP sitting:   110 / 60  (left arm)  Vitals Entered By: Milford Cage NCMA (February 01, 2010 8:29 AM)  Physical Exam  General:  Well developed, well nourished, no acute distress. Eyes:  anicteric Mouth:  clear pharynx Lungs:  Clear throughout to auscultation. Heart:  Regular rate and rhythm; no murmurs, rubs,  or bruits. 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 use acetaminophen, not Advil and seek evaluation with  PCP  Patient Instructions: 1)  Please pick up your medications at your pharmacy. AZATHIOPRINE 2)  Please bring a copy of your vaccine history to our office. 3)  Please use acetomenophen instead of Advil for your headaches. 4)  Please see your primary care physician about your headaches. 5)  Please schedule a follow-up appointment in 6-9 months. 6)  The medication list was reviewed and reconciled.  All changed / newly prescribed medications were explained.  A complete medication list was provided to the patient / caregiver.

## 2010-09-04 NOTE — Letter (Signed)
Summary: Office Visit Letter  [facility]  [address] [place], [place] [address]   Phone: [phone]  Fax: [phone]      Dec 22, 2009 MRN: [id number]   [facility] [address] [place], [place]  [address]   Dear Ms. Gaugh,   According to our records, it is time for you to schedule a follow-up office visit with [place].   At your convenience, please call [phone] (option #2)to schedule an office visit. If you have any questions, concerns, or feel that this letter is in error, we would appreciate your call.   Sincerely,  Iva Boop, M.D.  [facility] [phone]

## 2010-09-04 NOTE — Assessment & Plan Note (Signed)
Summary: annual f/u...as.   History of Present Illness Visit Type: follow up  Primary GI MD: Stan Head MD [facility] Primary Provider: [facility] Requesting Provider: n/a Chief Complaint: Crohn's History of Present Illness:   45 yo with Crohn's ileocolitis (ileum and cecum, diagnosed 2006) She has occ LLQ pain, transient no other sxs except notices increased gas with milk products no side effects noted from azathioprine   GI Review of Systems      Denies abdominal pain, acid reflux, belching, bloating, chest pain, dysphagia with liquids, dysphagia with solids, heartburn, loss of appetite, nausea, vomiting, vomiting blood, weight loss, and  weight gain.        Denies anal fissure, black tarry stools, change in bowel habit, constipation, diarrhea, diverticulosis, fecal incontinence, heme positive stool, hemorrhoids, irritable bowel syndrome, jaundice, light color stool, liver problems, rectal bleeding, and  rectal pain. Preventive Screening-Counseling & Management  Alcohol-Tobacco     Smoking Status: current     Smoking Cessation Counseling: yes     Smoke Cessation Stage: contemplative  Comments: quit smoking  x 1 week in December and restarted     Current Medications (verified): 1)  Omeprazole 20 Mg Cpdr (Omeprazole) .... Take 1 Tab Daily 2)  Azathioprine 50 Mg  Tabs (Azathioprine) .... Take 1 1/2 Tablets By Mouth Once Daily 3)  Vitamin D 1000 Unit  Tabs (Cholecalciferol) .Marland Kitchen.. 1 Once Daily  Allergies (verified): No Known Drug Allergies  Past History:  Past Medical History: Reviewed history from 08/19/2008 and no changes required. OSTEOARTHRITIS    - hx of injury in L knee with intermittent joint effusion CROHN'S DISEASE    - Ileitis, failed Asacol, on Azathioprine (Intermediate Metabolizer)    - dx'd in 2006    - followed by Dr. Leone Payor Hypovitaminosis D (treated) GERD (clinical dx)  Past Surgical History: Reviewed history from 08/19/2008 and no  changes required. s/p tubal ligation Left Knee Surgery Right Hand Surgery  Family History: No hx of IBD No family hx of breast or colon CA Mother: HTN 1 sister/2brothers: 1 brother with HTN No FH of Colon Cancer:  Social History: Patient restarted smoking, counselled to quit. 3 ciggs per day Single, unemployed laid off 1/11 Two dgtrs. No current boyfriends. Medicaid Alcohol Use - yes-on occasion Daily Caffeine Use-5 cups daily Illicit Drug Use - no Patient gets regular exercise.  Review of Systems       feels cold  Vital Signs:  Patient profile:   45 year old female Height:      64 inches Weight:      104 pounds BMI:     17.92 BSA:     1.48 Pulse rate:   88 / minute Pulse rhythm:   regular BP sitting:   98 / 60  (left arm) Cuff size:   regular  Vitals Entered By: Ok Anis CMA (August 21, 2009 10:13 AM)  Physical Exam  General:  Well developed, well nourished, no acute distress. Lungs:  Clear throughout to auscultation. Heart:  Regular rate and rhythm; no murmurs, rubs,  or bruits. Abdomen:  thin, soft and nontender Neurologic:  Alert and  oriented x4;  Psych:  Alert and cooperative. Normal mood and affect.   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  2)  Lactose Free Diet handout given.  3)  Return for a office appointment in 6 months 4)   Continue all medications as prescribed 5)  The medication list was reviewed and reconciled.  All changed / newly prescribed medications were explained.  A complete medication list was provided to the patient / caregiver.

## 2010-09-04 NOTE — Letter (Signed)
Summary: Office Visit Letter  [facility]  [address] [place], [place] [address]   Phone: [phone]  Fax: [phone]      January 10, 2010 MRN: [id number]   [facility] [address] [place], [place]  [address]   Dear Ms. Suddreth,   According to our records, it is time for you to schedule a follow-up office visit with [place].   At your convenience, please call [phone] (option #2)to schedule an office visit. If you have any questions, concerns, or feel that this letter is in error, we would appreciate your call.   Sincerely,    Iva Boop, M.D.  [facility] Gastroenterology Division [phone]

## 2010-09-04 NOTE — Miscellaneous (Signed)
Summary: BONE DENSITY  Clinical Lists Changes  Orders: Added new Test order of T-Bone Densitometry (77080) - Signed Added new Test order of T-Lumbar Vertebral Assessment (77082) - Signed 

## 2010-09-06 NOTE — Letter (Signed)
Summary: Office Visit Letter  [facility]  [address]   [place], [place] [address]   Phone: [phone]  Fax: [phone]      August 09, 2010 MRN: [id number]   [facility] [address] [place], [place]  [address]   Dear Ms. Stainback,   According to our records, it is time for you to schedule a follow-up office visit with [place].   At your convenience, please call [phone] (option #2)to schedule an office visit. If you have any questions, concerns, or feel that this letter is in error, we would appreciate your call.   Sincerely,   Stan Head, M.D.  [facility] Gastroenterology Division [phone]

## 2010-09-21 ENCOUNTER — Ambulatory Visit: Payer: Self-pay | Admitting: Internal Medicine

## 2010-09-27 ENCOUNTER — Telehealth: Payer: Self-pay | Admitting: Internal Medicine

## 2010-10-02 NOTE — Progress Notes (Signed)
Summary: Med refill  Phone Note Call from Patient Call back at Home Phone [phone]   Caller: Patient Call For: Dr. Leone Payor Reason for Call: Refill Medication Summary of Call: Needs her Az[facility]iprine [facility] Initial call taken by: Karna Christmas,  September 27, 2010 9:02 AM  Follow-up for Phone Call        Patient no showed for follow up office visit on 09-24-10 but patient needs refill on Azathioprine. Do you want me to refill this medication  Follow-up by: Ok Anis CMA,  September 27, 2010 10:12 AM  Additional Follow-up for Phone Call Additional follow up Details #1::        ok for 2 months she needs to eschedule rev also if not done Additional Follow-up by: Iva Boop MD, Clementeen Graham,  September 27, 2010 11:20 AM    Additional Follow-up for Phone Call Additional follow up Details #2::    I have attempted to contact patient via telephone. Left two messages for a call back. Per Dr. Leone Payor ok to refill medication for two months but patient must have an office visit for further refills on medication. Follow-up by: Ok Anis CMA,  September 27, 2010 1:13 PM  New/Updated Medications: AZATHIOPRINE 50 MG  TABS (AZATHIOPRINE) Take 1 1/2 tablets by mouth once daily(MUST HAVE OFFICE VISIT FOR FURTHER REFILLS) Prescriptions: AZATHIOPRINE 50 MG  TABS (AZATHIOPRINE) Take 1 1/2 tablets by mouth once daily(MUST HAVE OFFICE VISIT FOR FURTHER REFILLS)  #45 x 1   Entered by:   Ok Anis CMA   Authorized by:   Iva Boop MD, [facility]   Signed by:   Ok Anis CMA on 09/27/2010   Method used:   Electronically to        [facility] (retail)       [address]       [place], [place]  [id number]       Ph: [phone]       Fax: [phone]   RxID:   [id number]

## 2010-10-16 NOTE — Op Note (Signed)
Summary: Small Bowel Capsule Endoscopy  NAME:  [name], [name]               ACCOUNT NO.:  [id number]   MEDICAL RECORD NO.:  [id number]          PATIENT TYPE:  AMB   LOCATION:  ENDO                         FACILITY[facility]  [facility]   PHYSICIAN:  Iva Boop, M.D. LHCDATE OF BIRTH:  08-Jun-1966   DATE OF PROCEDURE:  12/02/2005  DATE OF DISCHARGE:  12/02/2005                                 OPERATIVE REPORT   PROCEDURE:  Small-bowel capsule endoscopy.   ATTENDING:  Iva Boop, M.D.   INDICATION:  Suspected Crohn's ileocolitis.  Abnormal CT in the past and  colonoscopy suggestive of this.   PROCEDURE DATA:  Height 64 inches, weight 105 pounds, thin build in this  patient.   GASTRIC PASSAGE TIME:  Ten minutes.  The capsule did not reach the colon.   FINDINGS:  Multiple ulcerations and erosions in the small intestine and  looked to be in the jejunum and ileum consistent with Crohn's ileitis.  She  has known large bowel Crohn's disease as well now.  I think this study  confirms a chronic inflammatory process consistent with Crohn's disease.   PLAN:  I will discuss this with the patient.  I will document capsule  passage and I think immuno-modulator therapy may be indicated.      Iva Boop, M.D. [facility]  Electronically Signed     CEG/MEDQ  D:  12/05/2005  T:  12/06/2005  Job:  [id number]

## 2010-10-18 ENCOUNTER — Encounter: Payer: Self-pay | Admitting: Internal Medicine

## 2010-10-18 ENCOUNTER — Ambulatory Visit (INDEPENDENT_AMBULATORY_CARE_PROVIDER_SITE_OTHER): Payer: Medicaid Other | Admitting: Internal Medicine

## 2010-10-18 ENCOUNTER — Other Ambulatory Visit: Payer: Medicaid Other

## 2010-10-18 DIAGNOSIS — K5080 Crohn's disease of both small and large intestine without complications: Secondary | ICD-10-CM

## 2010-10-18 DIAGNOSIS — K219 Gastro-esophageal reflux disease without esophagitis: Secondary | ICD-10-CM

## 2010-10-18 DIAGNOSIS — Z79899 Other long term (current) drug therapy: Secondary | ICD-10-CM

## 2010-10-22 ENCOUNTER — Other Ambulatory Visit: Payer: Self-pay | Admitting: Internal Medicine

## 2010-10-22 DIAGNOSIS — K5090 Crohn's disease, unspecified, without complications: Secondary | ICD-10-CM

## 2010-10-22 LAB — CONVERTED CEMR LAB: Hep B S Ab: NEGATIVE

## 2010-10-23 NOTE — Assessment & Plan Note (Signed)
Summary: follow up    History of Present Illness Visit Type: Follow-up Visit Primary GI MD: Stan Head MD [facility] Primary Provider: [facility] Requesting Provider: n/a Chief Complaint: Crohn's History of Present Illness:   45 yo AA woman with Crohn's ileitis on azathioprine for years with good control. Here for routine follow-up witout complaints. she also has GERd - controlled on PPI. l   GI Review of Systems      Denies abdominal pain, acid reflux, belching, bloating, chest pain, dysphagia with liquids, dysphagia with solids, heartburn, loss of appetite, nausea, vomiting, vomiting blood, weight loss, and  weight gain.        Denies anal fissure, black tarry stools, change in bowel habit, constipation, diarrhea, diverticulosis, fecal incontinence, heme positive stool, hemorrhoids, irritable bowel syndrome, jaundice, light color stool, liver problems, rectal bleeding, and  rectal pain.    Current Medications (verified): 1)  Omeprazole 20 Mg Cpdr (Omeprazole) .... Take 1 Tab Daily 2)  Azathioprine 50 Mg  Tabs (Azathioprine) .... Take 1 1/2 Tablets By Mouth Once Daily 3)  Vitamin D 1000 Unit  Tabs (Cholecalciferol) .Marland Kitchen.. 1 Once Daily  Allergies (verified): 1)  ! * Live Vaccines  Past History:  Past Medical History: OSTEOARTHRITIS    - hx of injury in L knee with intermittent joint effusion CROHN'S DISEASE    - Ileitis, failed Asacol, on Azathioprine (Intermediate Metabolizer)    - dx'd in 2006    - followed by Dr. Leone Payor Hypovitaminosis D (treated) GERD (clinical dx) Chronic Headaches  Past Surgical History: Reviewed history from 08/19/2008 and no changes required. s/p tubal ligation Left Knee Surgery Right Hand Surgery  Family History: Reviewed history from 08/21/2009 and no changes required. No hx of IBD No family hx of breast or colon CA Mother: HTN 1 sister/2brothers: 1 brother with HTN No FH of Colon Cancer:  Social History: Reviewed  history from 08/21/2009 and no changes required. Patient restarted smoking, counselled to quit. 3 ciggs per day Single, unemployed laid off 1/11 Two dgtrs. No current boyfriends. Medicaid Alcohol Use - yes-on occasion Daily Caffeine Use-5 cups daily Illicit Drug Use - no Patient gets regular exercise.  Vital Signs:  Patient profile:   45 year old female Height:      64 inches Weight:      112 pounds BMI:     19.29 BSA:     1.53 Pulse rate:   80 / minute Pulse rhythm:   regular BP sitting:   110 / 64  (left arm) Cuff size:   regular  Vitals Entered By: Ok Anis CMA (October 18, 2010 11:22 AM)   Physical Exam  General:  Well developed, well nourished, no acute distress. Eyes:  anicteric Lungs:  Clear throughout to auscultation. Heart:  Regular rate and rhythm; no murmurs, rubs,  or bruits. Abdomen:  thin, soft and nontender BS+, No HSM Psych:  Alert and cooperative. Normal mood and affect.   Impression & Recommendations:  Problem # 1:  CROHN'S DISEASE  SMALL AND LARGE INTESTINE (ICD-555.2)  Failed Pentasa, AZA started 09/29/06, has intermediate TPMT phenotype  doing well on azathioprine, will continue appears to be lactose intolerant  DEXA 12/10 ok (again)  Orders: T-Hepatitis B Surface Antibody ([id number]) T- * Misc. Laboratory test ([phone])  Problem # 2:  AZATHIOPRINE THERAPY (ICD-V58.69) is following at GYN for PAP smears - she says they are ok no toxicity evident, continue  lab monitoring every 3 mos  side effects and need for  monitoring reviewed  says tetanus UTD + c pox went to school needs pneumovax  Problem # 3:  GERD (ICD-530.81) Assessment: Unchanged continue PPI  Patient Instructions: 1)  Please go directly to the basement to have your labs drawn.  2)  Routine Blood work will be done on 11/09/10.  3)  Azathioprine refill has been sent to your pharmacy. 4)  Return to see [place] within 1 year.  5)  The medication list was reviewed and reconciled.   All changed / newly prescribed medications were explained.  A complete medication list was provided to the patient / caregiver. Prescriptions: AZATHIOPRINE 50 MG  TABS (AZATHIOPRINE) Take 1 1/2 tablets by mouth once daily  #45 x 2   Entered by:   Christie Nottingham CMA (AAMA)   Authorized by:   Iva Boop MD, [facility]   Signed by:   Iva Boop MD, FACG on 10/18/2010   Method used:   Electronically to        [facility] (retail)       [address]       [place], [place]  [id number]       Ph: [phone]       Fax: [phone]   RxID:   [id number]

## 2010-10-24 ENCOUNTER — Other Ambulatory Visit (INDEPENDENT_AMBULATORY_CARE_PROVIDER_SITE_OTHER): Payer: Medicaid Other

## 2010-10-24 ENCOUNTER — Ambulatory Visit (INDEPENDENT_AMBULATORY_CARE_PROVIDER_SITE_OTHER): Payer: Medicaid Other | Admitting: Internal Medicine

## 2010-10-24 DIAGNOSIS — K5090 Crohn's disease, unspecified, without complications: Secondary | ICD-10-CM

## 2010-10-24 DIAGNOSIS — D518 Other vitamin B12 deficiency anemias: Secondary | ICD-10-CM

## 2010-10-24 DIAGNOSIS — Z23 Encounter for immunization: Secondary | ICD-10-CM

## 2010-10-24 MED ORDER — CYANOCOBALAMIN 1000 MCG/ML IJ SOLN
1000.0000 ug | Freq: Once | INTRAMUSCULAR | Status: AC
  Administered 2010-10-24: 1000 ug via INTRAMUSCULAR

## 2010-10-24 NOTE — Progress Notes (Signed)
B12 injection was given in error.  Pt and Dr Leone Payor are aware.  No charge to pt

## 2010-10-31 ENCOUNTER — Ambulatory Visit (INDEPENDENT_AMBULATORY_CARE_PROVIDER_SITE_OTHER): Payer: Medicaid Other | Admitting: Internal Medicine

## 2010-10-31 DIAGNOSIS — K5090 Crohn's disease, unspecified, without complications: Secondary | ICD-10-CM

## 2010-10-31 DIAGNOSIS — Z23 Encounter for immunization: Secondary | ICD-10-CM

## 2010-11-01 NOTE — Progress Notes (Signed)
B12 administered in error.

## 2010-11-12 ENCOUNTER — Other Ambulatory Visit: Payer: Self-pay | Admitting: Internal Medicine

## 2010-11-12 DIAGNOSIS — K5080 Crohn's disease of both small and large intestine without complications: Secondary | ICD-10-CM

## 2010-11-15 ENCOUNTER — Ambulatory Visit (INDEPENDENT_AMBULATORY_CARE_PROVIDER_SITE_OTHER): Payer: Medicaid Other | Admitting: Internal Medicine

## 2010-11-15 ENCOUNTER — Other Ambulatory Visit (INDEPENDENT_AMBULATORY_CARE_PROVIDER_SITE_OTHER): Payer: Medicaid Other

## 2010-11-15 DIAGNOSIS — K5080 Crohn's disease of both small and large intestine without complications: Secondary | ICD-10-CM

## 2010-11-15 DIAGNOSIS — Z23 Encounter for immunization: Secondary | ICD-10-CM

## 2010-11-15 LAB — CBC WITH DIFFERENTIAL/PLATELET
Basophils Relative: 0.5 % (ref 0.0–3.0)
Eosinophils Relative: 0.8 % (ref 0.0–5.0)
Hemoglobin: 13.6 g/dL (ref 12.0–15.0)
MCV: 99.2 fl (ref 78.0–100.0)
Monocytes Absolute: 0.2 10*3/uL (ref 0.1–1.0)
Neutrophils Relative %: 56.9 % (ref 43.0–77.0)
RBC: 4.03 Mil/uL (ref 3.87–5.11)
WBC: 4.1 10*3/uL — ABNORMAL LOW (ref 4.5–10.5)

## 2010-11-15 LAB — HEPATIC FUNCTION PANEL
ALT: 15 U/L (ref 0–35)
Alkaline Phosphatase: 44 U/L (ref 39–117)
Bilirubin, Direct: 0.1 mg/dL (ref 0.0–0.3)
Total Protein: 7.4 g/dL (ref 6.0–8.3)

## 2010-11-17 NOTE — Progress Notes (Signed)
Quick Note:  Labs ok Repeat cbc and hepatic fx panel in early July 2012 Dx v58.69 and crohn's ______

## 2010-11-19 ENCOUNTER — Telehealth: Payer: Self-pay

## 2010-11-19 DIAGNOSIS — Z79899 Other long term (current) drug therapy: Secondary | ICD-10-CM

## 2010-11-19 DIAGNOSIS — K5090 Crohn's disease, unspecified, without complications: Secondary | ICD-10-CM

## 2010-11-19 NOTE — Progress Notes (Signed)
See phone note from 11/19/10

## 2010-11-19 NOTE — Telephone Encounter (Signed)
Patient aware.  We will give her a reminder call in July

## 2010-11-19 NOTE — Telephone Encounter (Signed)
Message copied by Darcey Nora on Mon Nov 19, 2010  3:09 PM ------      Message from: Stan Head      Created: Sat Nov 17, 2010 12:13 PM       Labs ok      Repeat cbc and hepatic fx panel in early July 2012      Dx v58.69 and crohn's

## 2010-12-18 NOTE — Group Therapy Note (Signed)
NAMEMCKENLEIGH, TARLTON               ACCOUNT NO.:  [id number]   MEDICAL RECORD NO.:  [id number]          PATIENT TYPE:  WOC   LOCATION:  [facility]                   FACILITY:  [facility]   PHYSICIAN:  Wynelle Bourgeois, CNM    DATE OF BIRTH:  [birth year]   DATE OF SERVICE:                                  CLINIC NOTE   HISTORY OF PRESENT ILLNESS:  This is a 45 year old gravida 3, para 2-0-1-  2 who is currently not pregnant who presents for her annual exam.  She  has been followed by Dr. Leone Payor for Crohn disease and he encouraged her  to followup with her annual exam and Pap smear.  She has no complaints  except for a recent history of late menses.  Her period was due about a  week ago and has not started yet.   PAST MEDICAL HISTORY:  Her medical history is remarkable for Crohn  disease, recently diagnosed.  She is treated by Dr. Leone Payor with  omeprazole, azathioprine, and vitamin D.  She is having some issues with  nutrition related to that, but is followed by Dr. Leone Payor.  Immunization  history is up-to-date.  She denies any other medical problems other than  hot flashes and headaches.  She does report problems with her hearing at  times.   SOCIAL HISTORY:  She smokes half a pack per day for 10 years and drinks  only socially.   PAST SURGICAL HISTORY:  Remarkable for tubal ligation.   PAST GYN HISTORY:  Remarkable for an abnormal Pap years ago with a  normal Pap in 2009.  She has not had a mammogram yet.   FAMILY HISTORY:  Negative per the patient.   REVIEW OF SYSTEMS:  The patient complains of being late for her period  and some intermittent abdominal pain related to Crohn.   OBJECTIVE DATA:  VITAL SIGNS:  Stable.  Temperature 98.4, blood pressure  98/64, weight is 101.3, height 5 feet 4 inches.  HEENT:  Within normal limits.  Thyroid normal  and enlarged.  CHEST:  Clear to auscultation.  HEART:  Regular rate and rhythm.  ABDOMEN:  Soft, nontender with no masses.  There is mild  tenderness in  the left lower quadrant.  BREASTS:  Soft, nontender with no masses.  GYN:  Normal external genitalia with no bleeding, erythema, or discharge  in her vaginal canal.  Cervix is normal with no lesions.  Uterus is  small and nontender with no masses.  Adnexa clear.  Pap obtained.  GC  and chlamydia were obtained per request.  EXTREMITIES:  Within normal limits.   ASSESSMENT:  2. A 45 year old black female who presents for annual exam.  2. Crohn disease.  3. Remote history of abnormal Pap.   PLAN:  1. Pap and GC chlamydia were sent.  2. UPEP is negative.  3. The patient will follow up with Dr. Leone Payor for her Crohn disease.  4. Return in 1 year for annual exam unless the patient needs to see [place]      before then.           ______________________________  Wynelle Bourgeois, CNM     MW/MEDQ  D:  03/02/2009  T:  03/03/2009  Job:  [id number]

## 2010-12-18 NOTE — Assessment & Plan Note (Signed)
[facility]                         GASTROENTEROLOGY OFFICE NOTE   [name], [name]                      MRN:          [id number]  DATE:06/19/2007                            DOB:          May 25, 1966    DATE OF VISIT:  June 19, 2007.   CHIEF COMPLAINT:  Followup of Crohn's disease.   She is losing weight down to 98 pounds.  She is eating, but she has lost  her appetite some and she feels stressed.  Last labs were okay, I put  her on Anticort, she had not been seen in several months, but I did see  her a month ago.  Azathioprine at 50 mg a day.  Diarrhea is better and  less pain.  Medications listed and reviewed in the chart.   Situational stressors in that she cannot find a job, and she is having  some family stress, she tells me.  No fever or chills, she does not  report any bleeding or drainage.  She continues to smoke and is  counseled to quit completely.  She says she has cut back.   PHYSICAL EXAMINATION:  VITAL SIGNS:  Height 5 feet 4, weight 98 pounds,  pulse 76, blood pressure 100/80.  EYES:  Anicteric.  SKIN:  Warm and dry.  ABDOMEN:  Soft, I do not find any tenderness or mass effect today.  Bowel sounds are present.  She is thin.  EXTREMITIES:  Free of edema.  There is not really any muscle wasting or  anything like that.  NEUROLOGIC:  She is alert and oriented x3.   ASSESSMENT:  Crohn's ileitis.  She is losing weight.  Her symptoms are  improved with the addition of Anticort.   I think there may be some depressive symptomatology contributing to  anorexia perhaps.  She has never been heavy at any stretch, but this is  the lowest weight she has been since I have met her.   PLAN:  1. Force food, consider Ensure or Boost if she can get that.  2. Continue current medications.  3. Restage with capsule endoscopy of the small bowel.  Risk of capsule      impaction in the intestine has been described to the patient.  I      think  that is very unlikely given that she tolerated it before.  4. Further plans pending that, may need an antidepressant, but I      overall would probably get her off the Anticort and try to inch up      on the Azathioprine, as she is an intermediate metabolizer.      Consider IBD profiling with serologies to see what type of disease      she has to see if that would help with therapy.  5. DEXA scan and Vitamin D level today.  She has had steroids, she is      at risk for osteopenia or osteoporosis, and Vitamin D deficiency.  6. Note, she has symptoms of lactose intolerance that is not      surprising given her Crohn's disease.  Iva Boop, MD,FACG  Electronically Signed    CEG/MedQ  DD: 06/19/2007  DT: 06/19/2007  Job #: [id number]   cc:   Tresa Endo L. Philipp Deputy, M.D.

## 2010-12-18 NOTE — Assessment & Plan Note (Signed)
[facility]                         GASTROENTEROLOGY OFFICE NOTE   REKA, WIST                      MRN:          [id number]  DATE:05/06/2007                            DOB:          10/11/1965    CHIEF COMPLAINT:  Abdominal pain and diarrhea.   Ms. Uselton has Crohn's ileitis. She had been on azathioprine though I  had not seen her since February. Her labs have been stable. She  indicates that she has remained on her azathioprine since that time, but  it is not clear to me she really had enough refills to do so. Two months  ago, she started having some crampy left mid-quadrant pain off and on  and she has had diarrhea return. In the past, her symptoms had been  right lower quadrant pain. She denies fever, nausea, or vomiting. Her  weight is about stable. Situational stressors are a problem in that she  is trying to find a job and cannot, and there are some issues with the  father of her children and she is trying to get her children counseling  and that has been stressing her somewhat. In the past, she has taken  Entocort EC without difficulty. She has just run out of her azathioprine  saying that yesterday was the last dose that she has had.   PAST MEDICAL HISTORY:  Crohn's ileitis, confirmed by small bowel capsule  endoscopy, colonoscopy with ileostomy. She has also had CT scanning  initially, as well as a small bowel series. She did not respond to  Pentasa. Prednisone makes her swell and bloat quite a bite.   LABORATORY DATA:  CBC normal, CMET glucose 100, otherwise normal. A  sedimentation rate is pending.   MEDICATIONS:  She is taking omeprazole daily, Advil p.r.n., and  azathioprine as described above.   PHYSICAL EXAMINATION:  GENERAL:  Thin, no acute distress. She looks okay  i.e. well developed, well nourished although she is thin.  VITAL SIGNS:  Weight 104 pounds, height 5 foot 4 inches, pulse 80, blood  pressure 92/58.  ABDOMEN:  Shows some left mid-quadrant tenderness without organomegaly  or mass. Right lower quadrant is mildly tender to palpation as well. No  mass there.  SKIN:  Warm and dry.  NEUROLOGIC:  She is alert and oriented x3.   ASSESSMENT:  Crohn's ileitis. She is now having left sided abdominal  pain. Question irritable bowel problems on top of that, she is having  some diarrhea. It is not really clear to me that she has been compliant  with her azathioprine. I think that it may not be.   PLAN:  1. Continue azathioprine 50 mg daily. She is an intermediate      metabolizer, so we have gone with a lower dose. Consider increasing      the dose over time.  2. Entocort EC 9 mg daily for what I perceive to be a Crohn's disease      flare.  3. She is re-educated about the chronic nature of Crohn's disease and      a handout is given. I have explained  to her again that she must      take medicine daily to prevent recurrent problems from this.  4. We will also treat with Bentyl (dicyclomine) 10 mg 1 to 2 every 6      hours p.r.n. cramps.  5. She will come back in approximately 6 weeks for reassessment. Need      to consider vitamin D testing at some point and potentially even      bone densitometry.     Iva Boop, MD,FACG  Electronically Signed    CEG/MedQ  DD: 05/06/2007  DT: 05/07/2007  Job #: [id number]

## 2010-12-21 NOTE — Assessment & Plan Note (Signed)
[facility]                         GASTROENTEROLOGY OFFICE NOTE   [name], [name]                      MRN:          [id number]  DATE:09/29/2006                            DOB:          1965/10/26    CHIEF COMPLAINT:  Followup of Crohn's ileocolitis.   Levy is about the same.  Some intermittent abdominal pain  occasionally.  She missed her January appointment because of a car  problem.  She had a TPMT phenotype which shows intermediate activity.  She remains on Pentasa 4 times a day (1 gram) and Entocort 9 mg a day.  Occasional diarrhea.  Her weight today is 108 pounds, which is up 2  pounds.  She is not describing any bleeding or any severe symptoms.   Height 5 foot 4 inches, weight 108 pounds, pulse 72, blood pressure  98/64.  ABDOMEN:  Shows some mild right lower quadrant tenderness and fullness  as before, otherwise normal.   Labs from December 31 showed a normal CBC, glucose 100, ALT 52, amylase  176 which was not a new problem, her lipase was normal.  Her CRP was  less than 1.   ASSESSMENT:  Crohn's ileocolitis.  She has done reasonably well but has  required steroids.  Prednisone has caused her to swell and bloat.  I  doubt the Pentasa is doing much.   PLAN:  1. Start azathioprine at 50 mg daily due to her intermediate      metabolism.  We will discontinue her Pentasa as well.  This would      also potentially raise the level of metabolites in her with that      and I think it is not that efficacious as well.  2. Return to see me in a month.  3. CBC with diff, amylase, lipase, hepatic function panel in one week      and further followup after that.  I have again discussed possible      side effects including bone marrow suppression, leukemia, lymphoma,      infections, hepatitis, idiosyncratic reactions, and pancreatitis      regarding the azathioprine.  She also understands the indications      for this, i.e., steroid sparing  therapy.  A handout is given to the      patient as well to review this.     Iva Boop, MD,FACG  Electronically Signed    CEG/MedQ  DD: 09/29/2006  DT: 09/29/2006  Job #: [id number]

## 2010-12-21 NOTE — Op Note (Signed)
[place]  Patient:    [name], [name]                      MRN: [id number] Proc. Date: 03/11/01 Adm. Date:  [id number] Attending:  Marlowe Shores                           Operative Report  PREOPERATIVE DIAGNOSES:  Right wrist dorsal radial laceration.  POSTOPERATIVE DIAGNOSES:  Right wrist dorsal radial laceration.  PROCEDURE:  Repair of EPL tendon, right wrist as well as repair of partial tendon lacerations to the EDC, index, and long fingers, right wrist.  SURGEON:  Artist Pais. Mina Marble, M.D.  ASSISTANT:  RN  ANESTHESIA:  General.  TOURNIQUET TIME:  45 minutes.  COMPLICATIONS:  None.  DRAINS:  None.  PROCEDURE:  Patient was taken to the operating room.  After the induction of adequate general anesthesia the right upper extremity was prepped and draped in the usual sterile fashion.  An Esmarch was used to exsanguinate the limb. Tourniquet was inflated to 250 mmHg.  At this point in time an oblique laceration across the intersection between the second and third dorsal compartments extended proximally for 2 cm and a large dorsally based flap was elevated.  Inspection of the area revealed a laceration to the extensor retinaculum overlying the second, third, and fourth compartments.  This was debrided of clot until the tendons were identified.  The proximal end of the EPL tendon was identified.  The distal end was identified in the sheath distally.  Using a small curved hemostat the distal end was retrieved and then drawn into the proximal aspect of the wound.  At this point in time the EPL tendon was repaired using a double ______ 4-0 Mersilene using a modified tajima suture with a 6-0 epitendinous Prolene locked suture to follow.  After this was done the [facility] to the long and index fingers which had partial lacerations, one 30%, one 70%, were repaired using the 6-0 Prolene in locked epitendinous sutures.  The wound was then  thoroughly irrigated.  The retinaculum overlying the third and fourth compartments was loosely closed using the 4-0 Mersilene and the skin was then closed with a running 3-0 Prolene subcuticular stitch.  Steri-Strips, 4 x 4 Plus, and a compressive dressing was applied.  The patient also had a splint applied that kept the thumb in maximum extension as well as the index and long fingers in extension. Patient tolerated procedure well and went to recovery in stable fashion. DD:  03/11/01 TD:  03/11/01 Job: [id number]

## 2010-12-21 NOTE — Op Note (Signed)
[name], [name]               ACCOUNT NO.:  [id number]   MEDICAL RECORD NO.:  [id number]          PATIENT TYPE:  AMB   LOCATION:  ENDO                         FACILITY:  [facility]   PHYSICIAN:  Iva Boop, M.D. LHCDATE OF BIRTH:  03/21/1966   DATE OF PROCEDURE:  12/02/2005  DATE OF DISCHARGE:  12/02/2005                                 OPERATIVE REPORT   PROCEDURE:  Small-bowel capsule endoscopy.   ATTENDING:  Iva Boop, M.D.   INDICATION:  Suspected Crohn's ileocolitis.  Abnormal CT in the past and  colonoscopy suggestive of this.   PROCEDURE DATA:  Height 64 inches, weight 105 pounds, thin build in this  patient.   GASTRIC PASSAGE TIME:  Ten minutes.  The capsule did not reach the colon.   FINDINGS:  Multiple ulcerations and erosions in the small intestine and  looked to be in the jejunum and ileum consistent with Crohn's ileitis.  She  has known large bowel Crohn's disease as well now.  I think this study  confirms a chronic inflammatory process consistent with Crohn's disease.   PLAN:  I will discuss this with the patient.  I will document capsule  passage and I think immuno-modulator therapy may be indicated.      Iva Boop, M.D. [facility]  Electronically Signed     CEG/MEDQ  D:  12/05/2005  T:  12/06/2005  Job:  [id number]

## 2010-12-21 NOTE — Assessment & Plan Note (Signed)
[facility]                         GASTROENTEROLOGY OFFICE NOTE   [name], [name]                      MRN:          [id number]  DATE:08/04/2006                            DOB:          1966/05/30    CHIEF COMPLAINT:  Follow up of Crohn's disease.   HISTORY:  [name] has run out of her Pentasa.  She reduced the dose  and was only taking 2 a day for the last week or so, perhaps longer.  She feels reasonably well though is having some intermittent spells  where she does not move her bowels and then she has numerous bowel  movements.  She has some mild right lower quadrant discomfort.  She is  more open to the idea of immunomodulator therapy  at this time she tells  me.   MEDICATIONS:  Pentasa, supposed to be on 1 gram 4x a day, only on 500 mg  b.i.d. right now   REVIEW OF SYSTEMS:  Her pruritus ani seems to be improved.  She was  given some Calmoseptine.  She continues to have some left knee pain.  She was not given any prescriptions in the outpatient clinic.  She has  had this mildly elevated amylase in the past, of unclear etiology.   PAST MEDICAL HISTORY:  Urinary tract infection and osteoarthritis.   PHYSICAL EXAMINATION:  Thin, young black woman in no acute distress.  Her weight is 106 pounds, down 3 pounds from August.  Pulse 75, blood  pressure 90/60.  ABDOMEN:  Scaphoid, thin.  She has some right greater than left lower  quadrant tenderness.  SKIN:  Warm and dry without rash.   ASSESSMENT:  Crohn's disease of the ilium with some increase in symptoms  and some noncompliance.   PLAN:  1. Refill Pentasa.  2. Start Anticort EC 9 mg daily for increased symptoms.  3. Check CBC, C-reactive protein and CMET and a promethia CPMT      phenotype.  4. Follow in one month at which point I think we will try to      transition to an immunomodulator.     Alexandra Boop, MD,FACG  Electronically Signed    CEG/MedQ  DD: 08/04/2006   DT: 08/04/2006  Job #: [id number]   cc:   [name]

## 2010-12-21 NOTE — Assessment & Plan Note (Signed)
[facility]                         GASTROENTEROLOGY OFFICE NOTE   NAME:MEBANEMarjo, [name]                      MRN:          [id number]  DATE:06/26/2007                            DOB:          [birth year]    PROCEDURE:  Small bowel capsule endoscopy.   NOTATION:  Please see the printed report with photos for full details.   FINDINGS:  There is edematous, erythematous and inflamed small bowel in  the ileum.  This is consistent with Crohn's disease.  No frank  ulcerations.  She also has some collapsed lumen in the jejunum, of  unclear significance.  The capsule seemed to reflux back and forth  there.   PLAN:  She is on azathioprine and Anticort EC.  The Anticort EC is not  to be continued long-term.  I think I am going to have to increase her  azathioprine.  She is an intermediate metabolizer, so we will do that  carefully.     Iva Boop, MD,FACG  Electronically Signed    CEG/MedQ  DD: 07/07/2007  DT: 07/08/2007  Job #: [id number]

## 2010-12-21 NOTE — Assessment & Plan Note (Signed)
[facility]                           GASTROENTEROLOGY OFFICE NOTE   [name], [name]                      MRN:          [id number]  DATE:03/11/2006                            DOB:          [birth year]    CHIEF COMPLAINT:  Follow up of Crohn's disease.   MEDICATIONS:  Pentasa 1 g four times a day.   DRUG ALLERGIES:  NONE KNOWN.   ASSESSMENT:  1.  Crohn's ileitis.  She is stable without diarrhea or abdominal pain.  She      has some fullness and tenderness in the right lower quadrant on physical      exam.  Her weight has fluctuated some but is generally stable.  2.  Pruritus ani, inspection of the anus and rectal area shows no problems.   PLAN:  1.  Continue Pentasa.  2.  We have discussed immunomodulator therapy.  She has been reluctant to      try that.  She does seem to be improved though.  She still has some      physical findings suggestive of ongoing Crohn's disease.  3.  Pruritus ani will be treated with __________.  4.  I will reassess her in 3 months.  5.  She has left knee swelling and pain which may be osteoarthritis      problems, there has been chronic issues.  Celebrex is the best NSAID,      given her Crohn's disease, and she really should take that over others,      if NSAIDs are going to be used.  A note was given to her to take to her      clinic appointment in the [facility].                                   Iva Boop, MD, Clementeen Graham   CEG/MedQ  DD:  03/11/2006  DT:  03/11/2006  Job #:  [id number]   cc:   [facility]

## 2011-03-21 ENCOUNTER — Telehealth: Payer: Self-pay | Admitting: Internal Medicine

## 2011-03-21 NOTE — Telephone Encounter (Signed)
Left message for patient to call back  

## 2011-03-22 NOTE — Telephone Encounter (Signed)
Patient is c/o constipation.  She is advised that she can try Miralax.  I have also reminded her she is due for lab work.  She will come next week

## 2011-03-28 ENCOUNTER — Other Ambulatory Visit (INDEPENDENT_AMBULATORY_CARE_PROVIDER_SITE_OTHER): Payer: Medicaid Other

## 2011-03-28 DIAGNOSIS — K5090 Crohn's disease, unspecified, without complications: Secondary | ICD-10-CM

## 2011-03-28 DIAGNOSIS — Z79899 Other long term (current) drug therapy: Secondary | ICD-10-CM

## 2011-03-28 LAB — CBC WITH DIFFERENTIAL/PLATELET
Basophils Absolute: 0 10*3/uL (ref 0.0–0.1)
HCT: 37.8 % (ref 36.0–46.0)
Hemoglobin: 12.7 g/dL (ref 12.0–15.0)
Lymphs Abs: 1.4 10*3/uL (ref 0.7–4.0)
MCV: 99.4 fl (ref 78.0–100.0)
Monocytes Relative: 7 % (ref 3.0–12.0)
Neutro Abs: 2.3 10*3/uL (ref 1.4–7.7)
RDW: 13.3 % (ref 11.5–14.6)

## 2011-03-28 LAB — HEPATIC FUNCTION PANEL
Alkaline Phosphatase: 57 U/L (ref 39–117)
Bilirubin, Direct: 0 mg/dL (ref 0.0–0.3)
Total Bilirubin: 0.4 mg/dL (ref 0.3–1.2)

## 2011-04-01 ENCOUNTER — Telehealth: Payer: Self-pay

## 2011-04-01 DIAGNOSIS — K5090 Crohn's disease, unspecified, without complications: Secondary | ICD-10-CM

## 2011-04-01 NOTE — Telephone Encounter (Signed)
Patient advised new labs entered for November

## 2011-04-01 NOTE — Telephone Encounter (Signed)
Message copied by Annett Fabian on Mon Apr 01, 2011  3:50 PM ------      Message from: Stan Head E      Created: Mon Apr 01, 2011  1:32 PM       Labs are normal or unchanged and acceptable. Repeat CBC and hepatic function panel in 3 months as usual. Please notify patient.

## 2011-04-01 NOTE — Progress Notes (Signed)
Quick Note:  Labs are normal or unchanged and acceptable. Repeat CBC and hepatic function panel in 3 months as usual. Please notify patient. ______ 

## 2011-04-09 ENCOUNTER — Telehealth: Payer: Self-pay | Admitting: Internal Medicine

## 2011-04-09 NOTE — Telephone Encounter (Signed)
Left message for patient to call back  

## 2011-04-10 MED ORDER — AZATHIOPRINE 50 MG PO TABS: ORAL_TABLET | ORAL | Status: DC

## 2011-04-10 NOTE — Telephone Encounter (Signed)
Patient needs a refill on her azathioprine.  I have sent a refill to her pharmacy.

## 2011-04-25 ENCOUNTER — Other Ambulatory Visit: Payer: Self-pay | Admitting: Internal Medicine

## 2011-04-30 ENCOUNTER — Other Ambulatory Visit: Payer: Self-pay | Admitting: Internal Medicine

## 2011-04-30 NOTE — Telephone Encounter (Signed)
Patient informed that the prescription was sent in on 04/10/11 but was never picked up so the pharmacy put it back on the shelf. Pharmacy filled medication and it can be picked up.

## 2011-05-30 ENCOUNTER — Other Ambulatory Visit: Payer: Self-pay | Admitting: Internal Medicine

## 2011-08-09 ENCOUNTER — Telehealth: Payer: Self-pay | Admitting: Internal Medicine

## 2011-08-09 NOTE — Telephone Encounter (Signed)
Patient is overdue for lab work at the end of November.  She will come for labs and I have set her up for an appt for 08/30/11 9:00

## 2011-08-19 ENCOUNTER — Other Ambulatory Visit: Payer: Self-pay

## 2011-08-19 ENCOUNTER — Other Ambulatory Visit (INDEPENDENT_AMBULATORY_CARE_PROVIDER_SITE_OTHER): Payer: Medicaid Other

## 2011-08-19 DIAGNOSIS — K5080 Crohn's disease of both small and large intestine without complications: Secondary | ICD-10-CM

## 2011-08-19 DIAGNOSIS — K5090 Crohn's disease, unspecified, without complications: Secondary | ICD-10-CM

## 2011-08-19 LAB — CBC WITH DIFFERENTIAL/PLATELET
Basophils Relative: 0.7 % (ref 0.0–3.0)
Eosinophils Absolute: 0.2 10*3/uL (ref 0.0–0.7)
HCT: 39.9 % (ref 36.0–46.0)
Hemoglobin: 13.6 g/dL (ref 12.0–15.0)
Lymphocytes Relative: 45.1 % (ref 12.0–46.0)
Lymphs Abs: 1.9 10*3/uL (ref 0.7–4.0)
MCHC: 34.1 g/dL (ref 30.0–36.0)
MCV: 96.5 fl (ref 78.0–100.0)
Monocytes Absolute: 0.3 10*3/uL (ref 0.1–1.0)
Neutro Abs: 1.8 10*3/uL (ref 1.4–7.7)
RBC: 4.14 Mil/uL (ref 3.87–5.11)

## 2011-08-19 LAB — HEPATIC FUNCTION PANEL
Albumin: 4.4 g/dL (ref 3.5–5.2)
Alkaline Phosphatase: 52 U/L (ref 39–117)
Total Protein: 7.7 g/dL (ref 6.0–8.3)

## 2011-08-19 NOTE — Progress Notes (Signed)
Quick Note:  Labs are normal or unchanged and acceptable. Repeat CBC and hepatic function panel in 3 months as usual. Please notify patient.  Also needs REV in March or April 2013 ______

## 2011-08-30 ENCOUNTER — Ambulatory Visit: Payer: Medicaid Other | Admitting: Internal Medicine

## 2011-09-17 ENCOUNTER — Ambulatory Visit (INDEPENDENT_AMBULATORY_CARE_PROVIDER_SITE_OTHER): Payer: Medicaid Other | Admitting: Internal Medicine

## 2011-09-17 ENCOUNTER — Encounter: Payer: Self-pay | Admitting: Internal Medicine

## 2011-09-17 VITALS — BP 100/60 | HR 77 | Ht 64.0 in | Wt 116.4 lb

## 2011-09-17 DIAGNOSIS — K5080 Crohn's disease of both small and large intestine without complications: Secondary | ICD-10-CM

## 2011-09-17 DIAGNOSIS — Z79899 Other long term (current) drug therapy: Secondary | ICD-10-CM | POA: Insufficient documentation

## 2011-09-17 NOTE — Assessment & Plan Note (Addendum)
Doing well at this time No change in therapy She is tolerating Azathioprine and has been compliant with lab follow-up. Return 1 year - sooner prn.

## 2011-09-17 NOTE — Progress Notes (Signed)
  Subjective:    Patient ID: [name], female    DOB: 15-Oct-1965, 46 y.o.   MRN: [id number]  HPI This middle-aged [place] American woman returns for routine followup of her Crohn's ileocolitis on long-term azathioprine therapy. She reports no problems except occasional constipation. Her bowel movements are otherwise regular without diarrhea or rectal bleeding. She has not had any significant abdominal pain problems, no fevers chills or other infectious problems identified. She is aware that she needs to have gynecologic followup annually and is planning to schedule that. She did not get a flu vaccine this year and is reminded that she should do so every year to reduce the risk of influenza in her immune suppressed status  Allergies  Allergen Reactions  . Other     Live Vaccines  . Penicillins    Outpatient Prescriptions Prior to Visit  Medication Sig Dispense Refill  . azaTHIOprine (IMURAN) 50 MG tablet Take 1 1/2 tablets (75 mg) by mouth daily  45 tablet  6  . omeprazole (PRILOSEC) 20 MG capsule TAKE 1 CAPSULE BY MOUTH ONCE DAILY  30 capsule  5   Past Medical History  Diagnosis Date  . Osteoarthritis   . Crohn's disease of small and large intestines   . Vitamin d deficiency   . GERD (gastroesophageal reflux disease)   . Chronic headaches    Past Surgical History  Procedure Date  . Tubal ligation   . Knee surgery     left  . Hand surgery     right  . Capsule endoscopy 06/26/2007    crohn's  . Colonoscopy 07/12/2005    ileo-colitis   History   Social History  . Marital Status: Single, with children      has been unemployed              Social History Main Topics  . Smoking status: Current Everyday Smoker    Types: Cigarettes  . Smokeless tobacco: Never Used  . Alcohol Use: Yes     every once in a while  . Drug Use: No  . Sexually Active: None     Review of Systems As above    Objective:   Physical Exam General:  NAD Eyes:   anicteric Lungs:   clear Heart:  S1S2 no rubs, murmurs or gallops Abdomen:  soft and nontender, BS+ Ext:   no edema         Assessment & Plan:

## 2011-09-17 NOTE — Assessment & Plan Note (Signed)
Doing well Routine lab monitoring Advised to get PAP smear Advised to get annual influenza vaccine Has had Hep A and B vaccnations

## 2011-09-17 NOTE — Patient Instructions (Addendum)
You have been given a separate informational sheet regarding your tobacco use, the importance of quitting and local resources to help you quit. You have been put in to come back in the middle of April (around the 12th) and have your labs drawn (CBC, CMET).

## 2011-09-17 NOTE — Assessment & Plan Note (Signed)
Doing well on Prilosec 20 mg daily. 

## 2011-11-14 ENCOUNTER — Other Ambulatory Visit: Payer: Self-pay

## 2011-11-14 ENCOUNTER — Other Ambulatory Visit (INDEPENDENT_AMBULATORY_CARE_PROVIDER_SITE_OTHER): Payer: Medicaid Other

## 2011-11-14 DIAGNOSIS — K5090 Crohn's disease, unspecified, without complications: Secondary | ICD-10-CM

## 2011-11-14 DIAGNOSIS — Z79899 Other long term (current) drug therapy: Secondary | ICD-10-CM

## 2011-11-14 LAB — CBC WITH DIFFERENTIAL/PLATELET
Basophils Absolute: 0 10*3/uL (ref 0.0–0.1)
Basophils Relative: 0.5 % (ref 0.0–3.0)
Eosinophils Absolute: 0 10*3/uL (ref 0.0–0.7)
Lymphocytes Relative: 25.5 % (ref 12.0–46.0)
MCHC: 32.8 g/dL (ref 30.0–36.0)
MCV: 98.4 fl (ref 78.0–100.0)
Monocytes Absolute: 0.3 10*3/uL (ref 0.1–1.0)
Neutrophils Relative %: 67.5 % (ref 43.0–77.0)
Platelets: 150 10*3/uL (ref 150.0–400.0)
RBC: 3.87 Mil/uL (ref 3.87–5.11)

## 2011-11-14 LAB — COMPREHENSIVE METABOLIC PANEL
ALT: 14 U/L (ref 0–35)
AST: 21 U/L (ref 0–37)
Albumin: 4 g/dL (ref 3.5–5.2)
Alkaline Phosphatase: 48 U/L (ref 39–117)
Calcium: 9.2 mg/dL (ref 8.4–10.5)
Chloride: 107 mEq/L (ref 96–112)
Potassium: 4.3 mEq/L (ref 3.5–5.1)
Sodium: 140 mEq/L (ref 135–145)
Total Protein: 7.1 g/dL (ref 6.0–8.3)

## 2011-11-14 NOTE — Progress Notes (Signed)
Quick Note:  Labs are normal or unchanged and acceptable. Repeat CBC and hepatic function panel in 3 months as usual. Please notify patient. ______ 

## 2011-11-26 ENCOUNTER — Ambulatory Visit (INDEPENDENT_AMBULATORY_CARE_PROVIDER_SITE_OTHER): Payer: Medicaid Other | Admitting: Internal Medicine

## 2011-11-26 VITALS — Wt 121.0 lb

## 2011-11-26 DIAGNOSIS — Z23 Encounter for immunization: Secondary | ICD-10-CM

## 2011-11-26 DIAGNOSIS — K5090 Crohn's disease, unspecified, without complications: Secondary | ICD-10-CM

## 2012-01-28 ENCOUNTER — Other Ambulatory Visit: Payer: Self-pay | Admitting: Internal Medicine

## 2012-01-28 MED ORDER — OMEPRAZOLE 20 MG PO CPDR: 20.0000 mg | DELAYED_RELEASE_CAPSULE | Freq: Every day | ORAL | Status: DC

## 2012-01-28 NOTE — Telephone Encounter (Signed)
Per pt's request refill sent to her pharmacy.

## 2012-02-20 ENCOUNTER — Other Ambulatory Visit (INDEPENDENT_AMBULATORY_CARE_PROVIDER_SITE_OTHER): Payer: Medicaid Other

## 2012-02-20 DIAGNOSIS — K5090 Crohn's disease, unspecified, without complications: Secondary | ICD-10-CM

## 2012-02-20 LAB — CBC WITH DIFFERENTIAL/PLATELET
Basophils Absolute: 0 10*3/uL (ref 0.0–0.1)
Basophils Relative: 0.6 % (ref 0.0–3.0)
Eosinophils Absolute: 0.1 10*3/uL (ref 0.0–0.7)
Lymphocytes Relative: 40 % (ref 12.0–46.0)
MCHC: 33.2 g/dL (ref 30.0–36.0)
MCV: 99.1 fl (ref 78.0–100.0)
Monocytes Absolute: 0.3 10*3/uL (ref 0.1–1.0)
Neutrophils Relative %: 49.1 % (ref 43.0–77.0)
RBC: 3.87 Mil/uL (ref 3.87–5.11)
RDW: 14.2 % (ref 11.5–14.6)

## 2012-02-20 LAB — HEPATIC FUNCTION PANEL
Albumin: 4.1 g/dL (ref 3.5–5.2)
Alkaline Phosphatase: 41 U/L (ref 39–117)
Bilirubin, Direct: 0.1 mg/dL (ref 0.0–0.3)
Total Protein: 7.3 g/dL (ref 6.0–8.3)

## 2012-02-21 ENCOUNTER — Other Ambulatory Visit: Payer: Self-pay

## 2012-02-21 DIAGNOSIS — K5080 Crohn's disease of both small and large intestine without complications: Secondary | ICD-10-CM

## 2012-02-21 NOTE — Progress Notes (Signed)
Quick Note:  Labs are normal or unchanged and acceptable. Repeat CBC and hepatic function panel in 3 months as usual. Please notify patient. ______ 

## 2012-03-04 ENCOUNTER — Other Ambulatory Visit: Payer: Self-pay | Admitting: Internal Medicine

## 2012-03-04 MED ORDER — AZATHIOPRINE 50 MG PO TABS: ORAL_TABLET | ORAL | Status: DC

## 2012-03-04 MED ORDER — OMEPRAZOLE 20 MG PO CPDR: 20.0000 mg | DELAYED_RELEASE_CAPSULE | Freq: Every day | ORAL | Status: DC

## 2012-03-04 NOTE — Telephone Encounter (Signed)
Pt informed that refills sent to pharmacy per her request.

## 2012-05-26 ENCOUNTER — Other Ambulatory Visit: Payer: Self-pay

## 2012-05-26 ENCOUNTER — Other Ambulatory Visit (INDEPENDENT_AMBULATORY_CARE_PROVIDER_SITE_OTHER): Payer: Medicaid Other

## 2012-05-26 DIAGNOSIS — K5080 Crohn's disease of both small and large intestine without complications: Secondary | ICD-10-CM

## 2012-05-26 LAB — CBC WITH DIFFERENTIAL/PLATELET
Basophils Relative: 0.9 % (ref 0.0–3.0)
Eosinophils Absolute: 0.1 10*3/uL (ref 0.0–0.7)
Hemoglobin: 12.8 g/dL (ref 12.0–15.0)
Lymphs Abs: 2.1 10*3/uL (ref 0.7–4.0)
MCHC: 32.9 g/dL (ref 30.0–36.0)
MCV: 98.6 fl (ref 78.0–100.0)
Monocytes Absolute: 0.4 10*3/uL (ref 0.1–1.0)
Neutro Abs: 2 10*3/uL (ref 1.4–7.7)
RBC: 3.95 Mil/uL (ref 3.87–5.11)
RDW: 14.4 % (ref 11.5–14.6)

## 2012-05-26 LAB — HEPATIC FUNCTION PANEL
Albumin: 3.7 g/dL (ref 3.5–5.2)
Total Protein: 6.9 g/dL (ref 6.0–8.3)

## 2012-05-26 NOTE — Progress Notes (Signed)
Quick Note:  Labs are normal or unchanged and acceptable. Repeat CBC and hepatic function panel in 3 months as usual. Please notify patient. ______ 

## 2012-08-28 ENCOUNTER — Other Ambulatory Visit (INDEPENDENT_AMBULATORY_CARE_PROVIDER_SITE_OTHER): Payer: Medicaid Other

## 2012-08-28 DIAGNOSIS — K5080 Crohn's disease of both small and large intestine without complications: Secondary | ICD-10-CM

## 2012-08-28 LAB — CBC WITH DIFFERENTIAL/PLATELET
Basophils Absolute: 0 10*3/uL (ref 0.0–0.1)
Basophils Relative: 0.6 % (ref 0.0–3.0)
Eosinophils Relative: 0.8 % (ref 0.0–5.0)
Hemoglobin: 12.6 g/dL (ref 12.0–15.0)
Lymphocytes Relative: 47.3 % — ABNORMAL HIGH (ref 12.0–46.0)
Monocytes Relative: 6.2 % (ref 3.0–12.0)
Neutro Abs: 1.8 10*3/uL (ref 1.4–7.7)
RBC: 3.83 Mil/uL — ABNORMAL LOW (ref 3.87–5.11)
RDW: 14.8 % — ABNORMAL HIGH (ref 11.5–14.6)
WBC: 4.1 10*3/uL — ABNORMAL LOW (ref 4.5–10.5)

## 2012-08-28 LAB — HEPATIC FUNCTION PANEL
AST: 21 U/L (ref 0–37)
Albumin: 4 g/dL (ref 3.5–5.2)
Alkaline Phosphatase: 47 U/L (ref 39–117)

## 2012-08-29 NOTE — Progress Notes (Signed)
Quick Note:  Labs are normal or unchanged and acceptable. Repeat CBC and hepatic function panel in 4 months late may She needs REV also -Feb. . Please notify patient. ______

## 2012-08-31 ENCOUNTER — Other Ambulatory Visit: Payer: Self-pay

## 2012-08-31 DIAGNOSIS — K5010 Crohn's disease of large intestine without complications: Secondary | ICD-10-CM

## 2012-10-30 ENCOUNTER — Ambulatory Visit: Payer: Medicaid Other | Admitting: Obstetrics & Gynecology

## 2013-02-19 ENCOUNTER — Telehealth: Payer: Self-pay | Admitting: Internal Medicine

## 2013-02-19 MED ORDER — AZATHIOPRINE 50 MG PO TABS: ORAL_TABLET | ORAL | Status: DC

## 2013-02-19 NOTE — Telephone Encounter (Signed)
Sent in one refill and noted she needs to call and set up appointment and get labs drawn ( orders in epic).

## 2013-04-13 ENCOUNTER — Other Ambulatory Visit: Payer: Self-pay | Admitting: Internal Medicine

## 2013-05-27 ENCOUNTER — Telehealth: Payer: Self-pay | Admitting: Internal Medicine

## 2013-05-27 DIAGNOSIS — K5090 Crohn's disease, unspecified, without complications: Secondary | ICD-10-CM

## 2013-05-27 MED ORDER — AZATHIOPRINE 50 MG PO TABS: ORAL_TABLET | ORAL | Status: DC

## 2013-05-27 NOTE — Telephone Encounter (Signed)
I  believe she should remain on azathioprine to keep Crohn's in remission She is going to need CBC and LFT's and can restart at same dose with 2 refills for now and needs to come see me also

## 2013-05-27 NOTE — Telephone Encounter (Signed)
I spoke with the [facility] outpatient clinic they have authorized 6 visits for [facility] NPI [id number] .  I spoke with [name].  [name] is aware to come for labs and restart on azathioprine at the previous dose.  She is scheduled for an outpatient visit for 07/07/13 9:45

## 2013-05-27 NOTE — Telephone Encounter (Signed)
Patient has not been on her azathioprine since July.  She is not having any crohn's symptoms at this time.  She ran out of medication and did not keep her follow up.  She does not have a co-pay now and is [place] [facility] and needs a referral from her primary care to authorize her to return here.  Her last office visit here was in in Feb.  She is wondering if she needs to restart azathioprine or ok to remain off of it.  I have asked her to contact her primary care to get permission to schedule an office visit here.  She has not had follow up with them in some time either.  Please advise

## 2013-06-02 ENCOUNTER — Other Ambulatory Visit (INDEPENDENT_AMBULATORY_CARE_PROVIDER_SITE_OTHER): Payer: Medicaid Other

## 2013-06-02 DIAGNOSIS — K5090 Crohn's disease, unspecified, without complications: Secondary | ICD-10-CM

## 2013-06-02 LAB — CBC WITH DIFFERENTIAL/PLATELET
Basophils Absolute: 0 10*3/uL (ref 0.0–0.1)
Eosinophils Absolute: 0.1 10*3/uL (ref 0.0–0.7)
Hemoglobin: 13.1 g/dL (ref 12.0–15.0)
Lymphocytes Relative: 42.4 % (ref 12.0–46.0)
MCHC: 33.9 g/dL (ref 30.0–36.0)
Monocytes Absolute: 0.5 10*3/uL (ref 0.1–1.0)
Neutro Abs: 2.9 10*3/uL (ref 1.4–7.7)
Neutrophils Relative %: 47.1 % (ref 43.0–77.0)
RDW: 13.6 % (ref 11.5–14.6)

## 2013-06-02 LAB — HEPATIC FUNCTION PANEL
Albumin: 4 g/dL (ref 3.5–5.2)
Alkaline Phosphatase: 55 U/L (ref 39–117)

## 2013-06-08 ENCOUNTER — Other Ambulatory Visit: Payer: Self-pay

## 2013-06-08 DIAGNOSIS — K5090 Crohn's disease, unspecified, without complications: Secondary | ICD-10-CM

## 2013-06-08 NOTE — Progress Notes (Signed)
Quick Note:  Labs are fine Will see her at REV  Repeat CBC and LFT's in 3 months ______

## 2013-07-07 ENCOUNTER — Other Ambulatory Visit (INDEPENDENT_AMBULATORY_CARE_PROVIDER_SITE_OTHER): Payer: Medicaid Other

## 2013-07-07 ENCOUNTER — Ambulatory Visit (INDEPENDENT_AMBULATORY_CARE_PROVIDER_SITE_OTHER): Payer: Medicaid Other | Admitting: Internal Medicine

## 2013-07-07 ENCOUNTER — Encounter: Payer: Self-pay | Admitting: Internal Medicine

## 2013-07-07 VITALS — BP 98/56 | HR 88 | Ht 64.0 in | Wt 120.8 lb

## 2013-07-07 DIAGNOSIS — K5080 Crohn's disease of both small and large intestine without complications: Secondary | ICD-10-CM

## 2013-07-07 DIAGNOSIS — Z23 Encounter for immunization: Secondary | ICD-10-CM

## 2013-07-07 DIAGNOSIS — E559 Vitamin D deficiency, unspecified: Secondary | ICD-10-CM

## 2013-07-07 DIAGNOSIS — Z79899 Other long term (current) drug therapy: Secondary | ICD-10-CM

## 2013-07-07 DIAGNOSIS — R51 Headache: Secondary | ICD-10-CM

## 2013-07-07 DIAGNOSIS — Z7960 Long term (current) use of unspecified immunomodulators and immunosuppressants: Secondary | ICD-10-CM

## 2013-07-07 LAB — BASIC METABOLIC PANEL
CO2: 29 mEq/L (ref 19–32)
Calcium: 10.2 mg/dL (ref 8.4–10.5)
Creatinine, Ser: 0.7 mg/dL (ref 0.4–1.2)
Glucose, Bld: 115 mg/dL — ABNORMAL HIGH (ref 70–99)
Sodium: 139 mEq/L (ref 135–145)

## 2013-07-07 NOTE — Progress Notes (Signed)
   Subjective:    Patient ID: [name], female    DOB: 11-07-1965, 47 y.o.   MRN: [id number]  HPI    Review of Systems     Objective:   Physical Exam General:  NAD Eyes:   anicteric Lungs:  clear Heart:  S1S2 no rubs, murmurs or gallops Abdomen:  soft and nontender, BS+ Ext:   no edema    Data Reviewed:          Assessment & Plan:

## 2013-07-07 NOTE — Patient Instructions (Signed)
Your physician has requested that you go to the basement for the following lab work before leaving today: BMET, Vitamin D  Today you have been given a flu vaccine and also a pneumococcal vaccine along with handouts on both.  I appreciate the opportunity to care for you.

## 2013-07-07 NOTE — Assessment & Plan Note (Signed)
Recurrent frontal headache - will see if I can get her to PCP

## 2013-07-07 NOTE — Assessment & Plan Note (Addendum)
Influenza, pneumovax today She has a GYN appointment in January

## 2013-07-07 NOTE — Assessment & Plan Note (Addendum)
Doing well overall, she was off her medication for about 4 months but is back on. He will continue azathioprine. I will see her in 6 months.

## 2013-07-08 LAB — VITAMIN D 25 HYDROXY (VIT D DEFICIENCY, FRACTURES): Vit D, 25-Hydroxy: 36 ng/mL (ref 30–89)

## 2013-07-09 NOTE — Progress Notes (Signed)
Quick Note:  Labs ok Stay on vit D supp ______

## 2013-08-09 ENCOUNTER — Ambulatory Visit (INDEPENDENT_AMBULATORY_CARE_PROVIDER_SITE_OTHER): Payer: Medicaid Other | Admitting: Nurse Practitioner

## 2013-08-09 ENCOUNTER — Other Ambulatory Visit (HOSPITAL_COMMUNITY)
Admission: RE | Admit: 2013-08-09 | Discharge: 2013-08-09 | Disposition: A | Discharge status: Home / Self Care | Payer: Medicaid Other | Source: Ambulatory Visit | Attending: Nurse Practitioner | Admitting: Nurse Practitioner

## 2013-08-09 ENCOUNTER — Encounter: Payer: Self-pay | Admitting: Nurse Practitioner

## 2013-08-09 VITALS — BP 113/73 | HR 83 | Temp 98.7°F | Ht 64.0 in | Wt 122.3 lb

## 2013-08-09 DIAGNOSIS — Z1151 Encounter for screening for human papillomavirus (HPV): Secondary | ICD-10-CM | POA: Insufficient documentation

## 2013-08-09 DIAGNOSIS — Z01419 Encounter for gynecological examination (general) (routine) without abnormal findings: Secondary | ICD-10-CM | POA: Insufficient documentation

## 2013-08-09 DIAGNOSIS — Z0000 Encounter for general adult medical examination without abnormal findings: Secondary | ICD-10-CM

## 2013-08-09 DIAGNOSIS — F17200 Nicotine dependence, unspecified, uncomplicated: Secondary | ICD-10-CM

## 2013-08-09 LAB — HEMOGLOBIN A1C
Hgb A1c MFr Bld: 5.8 % — ABNORMAL HIGH (ref <5.7)
MEAN PLASMA GLUCOSE: 120 mg/dL — AB (ref <117)

## 2013-08-09 NOTE — Progress Notes (Signed)
History:  [name] a 48 y.o. No obstetric history on file. [facility] for New annual Exam. She denies any active GYN problems. She has never had an abnormal pap except for one year when she had Chlamydia. She is requesting HIV today. She is sexually active rarely using condoms. She had a BTL years ago. Her last mammogram was 2011 and was WNL. She has Crohn disease and sees a GI specialist. Smoker. All past glucose are elevated. Never had thyroid checked  The following portions of the patient's history were reviewed and updated as appropriate: allergies, current medications, past family history, past medical history, past social history, past surgical history and problem list.  Review of Systems:  Pertinent items are noted in HPI.  Objective:  Physical Exam BP 113/73  Pulse 83  Temp(Src) 98.7 F (37.1 C) (Oral)  Ht 5\' 4"  (1.626 m)  Wt 122 lb 4.8 oz (55.475 kg)  BMI 20.98 kg/m2  LMP 04/07/2013 GENERAL: Well-developed, well-nourished female in no acute distress.  HEENT: Normocephalic, atraumatic.  NECK: Supple. Normal thyroid.  LUNGS: Normal rate. Clear to auscultation bilaterally.  HEART: Regular rate and rhythm with no adventitious sounds.  BREASTS: Symmetric in size. No masses, skin changes, nipple drainage, or lymphadenopathy. ABDOMEN: Soft, nontender, nondistended. No organomegaly. Normal bowel sounds appreciated in all quadrants.  PELVIC: Normal external female genitalia. Vagina is pink and rugated.  Normal discharge. Normal cervix contour. Pap smear obtained. Uterus is normal in size. No adnexal mass or tenderness.  EXTREMITIES: No cyanosis, clubbing, or edema, 2+ distal pulses.   Labs and Imaging No results found.  Assessment & Plan:  Assessment:  Annual Examination/ Pap Hx Crohn's Disease Smoker  Plans:  Pap today/done Needs mammogram Advised to stop smoking Check TSH, HgbA1C, HIV Advised yearly Exam  Delbert PhenixLinda M Crestina Strike,  NP 08/09/2013 5:43 PM

## 2013-08-09 NOTE — Patient Instructions (Signed)
Place annual gynecologic exam patient instructions here.  Will call with results of blood work Get yearly mammogram Stop smoking

## 2013-08-10 ENCOUNTER — Telehealth: Payer: Self-pay | Admitting: Internal Medicine

## 2013-08-10 LAB — TSH: TSH: 5.224 u[IU]/mL — ABNORMAL HIGH (ref 0.350–4.500)

## 2013-08-10 LAB — HIV ANTIBODY (ROUTINE TESTING W REFLEX): HIV: NONREACTIVE

## 2013-08-16 ENCOUNTER — Telehealth: Payer: Self-pay

## 2013-08-16 DIAGNOSIS — Z1231 Encounter for screening mammogram for malignant neoplasm of breast: Secondary | ICD-10-CM

## 2013-08-16 NOTE — Telephone Encounter (Signed)
Called pt and informed pt that she will need to follow up pap in one year.  Pt stated understanding and then she asked about scheduling a mammogram.  I scheduled mammogram for 08/30/13 @ 1045.  Pt agreed to appt with no further questions.

## 2013-08-16 NOTE — Telephone Encounter (Signed)
Message copied by Faythe CasaBELLAMY, Geneieve Duell M on Mon Aug 16, 2013  9:06 AM ------      Message from: BAREFOOT, Bonita QuinLINDA M      Created: Sat Aug 14, 2013  2:30 PM       Can she be called and asked to repeat Pap in one year, Thank you, Bonita QuinLinda ------

## 2013-08-30 ENCOUNTER — Ambulatory Visit (HOSPITAL_COMMUNITY)
Admission: RE | Admit: 2013-08-30 | Discharge: 2013-08-30 | Disposition: A | Discharge status: Home / Self Care | Payer: Medicaid Other | Source: Ambulatory Visit | Attending: Obstetrics & Gynecology | Admitting: Obstetrics & Gynecology

## 2013-08-30 DIAGNOSIS — Z1231 Encounter for screening mammogram for malignant neoplasm of breast: Secondary | ICD-10-CM | POA: Insufficient documentation

## 2013-08-31 ENCOUNTER — Other Ambulatory Visit: Payer: Self-pay | Admitting: Obstetrics & Gynecology

## 2013-08-31 DIAGNOSIS — R928 Other abnormal and inconclusive findings on diagnostic imaging of breast: Secondary | ICD-10-CM

## 2013-09-10 ENCOUNTER — Ambulatory Visit
Admission: RE | Admit: 2013-09-10 | Discharge: 2013-09-10 | Disposition: A | Discharge status: Home / Self Care | Payer: Medicaid Other | Source: Ambulatory Visit | Attending: Obstetrics & Gynecology | Admitting: Obstetrics & Gynecology

## 2013-09-10 DIAGNOSIS — R928 Other abnormal and inconclusive findings on diagnostic imaging of breast: Secondary | ICD-10-CM

## 2013-12-07 ENCOUNTER — Other Ambulatory Visit: Payer: Self-pay | Admitting: Internal Medicine

## 2014-04-25 ENCOUNTER — Ambulatory Visit (INDEPENDENT_AMBULATORY_CARE_PROVIDER_SITE_OTHER): Payer: Medicaid Other | Admitting: Internal Medicine

## 2014-04-25 ENCOUNTER — Other Ambulatory Visit (INDEPENDENT_AMBULATORY_CARE_PROVIDER_SITE_OTHER): Payer: Medicaid Other

## 2014-04-25 ENCOUNTER — Encounter: Payer: Self-pay | Admitting: Internal Medicine

## 2014-04-25 VITALS — BP 104/64 | HR 84 | Ht 64.0 in | Wt 123.0 lb

## 2014-04-25 DIAGNOSIS — K219 Gastro-esophageal reflux disease without esophagitis: Secondary | ICD-10-CM

## 2014-04-25 DIAGNOSIS — K5080 Crohn's disease of both small and large intestine without complications: Secondary | ICD-10-CM | POA: Diagnosis not present

## 2014-04-25 DIAGNOSIS — Z79899 Other long term (current) drug therapy: Secondary | ICD-10-CM

## 2014-04-25 DIAGNOSIS — Z7960 Long term (current) use of unspecified immunomodulators and immunosuppressants: Secondary | ICD-10-CM

## 2014-04-25 LAB — COMPREHENSIVE METABOLIC PANEL
ALT: 13 U/L (ref 0–35)
AST: 19 U/L (ref 0–37)
Albumin: 4.3 g/dL (ref 3.5–5.2)
Alkaline Phosphatase: 69 U/L (ref 39–117)
BUN: 13 mg/dL (ref 6–23)
CALCIUM: 10.5 mg/dL (ref 8.4–10.5)
CHLORIDE: 105 meq/L (ref 96–112)
CO2: 27 meq/L (ref 19–32)
Creatinine, Ser: 0.8 mg/dL (ref 0.4–1.2)
GFR: 99.29 mL/min (ref >60.00)
Glucose, Bld: 105 mg/dL — ABNORMAL HIGH (ref 70–99)
POTASSIUM: 4.5 meq/L (ref 3.5–5.1)
SODIUM: 139 meq/L (ref 135–145)
Total Bilirubin: 0.4 mg/dL (ref 0.2–1.2)
Total Protein: 8 g/dL (ref 6.0–8.3)

## 2014-04-25 LAB — CBC WITH DIFFERENTIAL/PLATELET
BASOS ABS: 0 10*3/uL (ref 0.0–0.1)
Basophils Relative: 0.6 % (ref 0.0–3.0)
Eosinophils Absolute: 0 10*3/uL (ref 0.0–0.7)
Eosinophils Relative: 0.4 % (ref 0.0–5.0)
HCT: 40.4 % (ref 36.0–46.0)
Hemoglobin: 13.6 g/dL (ref 12.0–15.0)
Lymphocytes Relative: 32.8 % (ref 12.0–46.0)
Lymphs Abs: 1.4 10*3/uL (ref 0.7–4.0)
MCHC: 33.8 g/dL (ref 30.0–36.0)
MCV: 96.7 fl (ref 78.0–100.0)
MONOS PCT: 6.5 % (ref 3.0–12.0)
Monocytes Absolute: 0.3 10*3/uL (ref 0.1–1.0)
NEUTROS PCT: 59.7 % (ref 43.0–77.0)
Neutro Abs: 2.6 10*3/uL (ref 1.4–7.7)
PLATELETS: 162 10*3/uL (ref 150.0–400.0)
RBC: 4.18 Mil/uL (ref 3.87–5.11)
RDW: 14.1 % (ref 11.5–15.5)
WBC: 4.4 10*3/uL (ref 4.0–10.5)

## 2014-04-25 MED ORDER — OMEPRAZOLE 20 MG PO CPDR: 20.0000 mg | DELAYED_RELEASE_CAPSULE | Freq: Every day | ORAL | Status: DC

## 2014-04-25 MED ORDER — AZATHIOPRINE 50 MG PO TABS: 75.0000 mg | ORAL_TABLET | Freq: Every day | ORAL | Status: DC

## 2014-04-25 NOTE — Assessment & Plan Note (Addendum)
Colonoscpy f/u exam, lasted 2006 at time of diagnosis plus she is in a brace to screen for colon cancer given that she is [place] The risks and benefits as well as alternatives of endoscopic procedure(s) have been discussed and reviewed. All questions answered. The patient agrees to proceed.  Refill AZA Labs today, CBC and comprehensive metabolic panel

## 2014-04-25 NOTE — Assessment & Plan Note (Signed)
Refill omeprazole

## 2014-04-25 NOTE — Assessment & Plan Note (Addendum)
Recheck labs today. Continue medications at this time. Pap smear is okay.

## 2014-04-25 NOTE — Progress Notes (Signed)
   Subjective:    Patient ID: [name], female    DOB: [birth year], 48 y.o.   MRN: [id number]  HPI Patient returns for followup. She did not make her. June appointment. She is doing well without any Crohn's symptoms at this time. She is interested in a flu vaccine but we do not have those in that I've advised her to call back.  Pap smear is up-to-date and okay.  Also had a mammogram this year. And his daughter is at [employer] in school, oldest daughter is working and planning on taking some online college.  Medications, allergies, past medical history, past surgical history, family history and social history are reviewed and updated in the EMR.  Review of Systems As above, she does continue to smoke and has been counseled to quit She is also complaining of leg is giving out when he goes up as that sometimes. Will get swelling in her ankles    Objective:   Physical Exam General:  NAD Eyes:   anicteric Lungs:  clear Heart:  S1S2 no rubs, murmurs or gallops Abdomen:  soft and nontender, BS+ Ext:   no edema    Assessment & Plan:  CROHN'S DISEASE  SMALL AND LARGE INTESTINE Colonoscpy f/u exam, lasted 2006 at time of diagnosis plus she is in a brace to screen for colon cancer given that she is African American The risks and benefits as well as alternatives of endoscopic procedure(s) have been discussed and reviewed. All questions answered. The patient agrees to proceed.  Refill AZA Labs today, CBC and comprehensive metabolic panel  GERD Refill omeprazole  Long-term use of immunosuppressant medication Recheck labs today. Continue medications at this time. Pap smear is okay.

## 2014-04-25 NOTE — Patient Instructions (Addendum)
You have been scheduled for a colonoscopy. Please follow written instructions given to you at your visit today.  If you use inhalers (even only as needed), please bring them with you on the day of your procedure. Your physician has requested that you go to www.startemmi.com and enter the access code given to you at your visit today. This web site gives a general overview about your procedure. However, you should still follow specific instructions given to you by our office regarding your preparation for the procedure.  Refill of Omeprazole was sent to your pharmacy.  Please call back in October to check if we have the Flu Vaccine and if we do please schedule a free nurse visit to receive your Flu shot.  Your physician has requested that you go to the basement for the following lab work before leaving today: CBC CMET  Thank you for the opportunity to care for you today.

## 2014-04-27 NOTE — Progress Notes (Signed)
Quick Note:  Please notify phone or letter - labs ok ______

## 2014-05-02 ENCOUNTER — Telehealth: Payer: Self-pay | Admitting: Internal Medicine

## 2014-05-02 NOTE — Telephone Encounter (Signed)
I reviewed the labs with the patient.  All questions answered

## 2014-06-14 ENCOUNTER — Ambulatory Visit (AMBULATORY_SURGERY_CENTER): Payer: Medicaid Other | Admitting: Internal Medicine

## 2014-06-14 ENCOUNTER — Encounter: Payer: Self-pay | Admitting: Internal Medicine

## 2014-06-14 VITALS — BP 116/87 | HR 73 | Temp 97.7°F | Resp 17 | Ht 64.0 in | Wt 123.0 lb

## 2014-06-14 DIAGNOSIS — K50819 Crohn's disease of both small and large intestine with unspecified complications: Secondary | ICD-10-CM

## 2014-06-14 MED ORDER — SODIUM CHLORIDE 0.9 % IV SOLN: 500.0000 mL | INTRAVENOUS | Status: DC

## 2014-06-14 NOTE — Op Note (Signed)
[facility] [address] [place], [id number]   COLONOSCOPY PROCEDURE REPORT  PATIENT: Alexandra MarketMebane, [name]  MR#: [id number] BIRTHDATE: 02/28/1966 , 48  yrs. old GENDER: female ENDOSCOPIST: Iva Booparl E Maryah Marinaro, MD, [facility] PROCEDURE DATE:  06/14/2014 PROCEDURE:   Colonoscopy, diagnostic First Screening Colonoscopy - Avg.  risk and is 50 yrs.  old or older - No.  Prior Negative Screening - Now for repeat screening. N/A  History of Adenoma - Now for follow-up colonoscopy & has been > or = to 3 yrs.  N/A  Polyps Removed Today? No.  Polyps Removed Today? No.  Recommend repeat exam, <10 yrs? Polyps Removed Today? No.  Recommend repeat exam, <10 yrs? No. ASA CLASS:   Class II INDICATIONS:high risk previously diagnosed Crohn's disease: large  small intestine. MEDICATIONS: Propofol 230 mg IV and Monitored anesthesia care  DESCRIPTION OF PROCEDURE:   After the risks benefits and alternatives of the procedure were thoroughly explained, informed consent was obtained.  The digital rectal exam revealed no abnormalities of the rectum.   The LB [id number]  endoscope was introduced through the anus and advanced to the terminal ileum which was intubated for a short distance. No adverse events experienced.   The quality of the prep was excellent, using MiraLax The instrument was then slowly withdrawn as the colon was fully examined.      COLON FINDINGS: 1) 2 small terminal ileal ulcers 2) Otherwise normal colonoscopy, excellent prep.  Retroflexed views revealed no abnormalities. The time to cecum=3 minutes 07 seconds. Withdrawal time=6 minutes 36 seconds.  The scope was withdrawn and the procedure completed. COMPLICATIONS: There were no immediate complications.  ENDOSCOPIC IMPRESSION: 1) 2 small terminal ileal ulcers 2) Otherwise normal colonoscopy, excellent prep  RECOMMENDATIONS: Repeat colonoscopy 10 years Office visit 1 year  eSigned:  Iva Booparl E Raseel Jans, MD, [facility]  06/14/2014 3:04 PM   cc: The Patient

## 2014-06-14 NOTE — Progress Notes (Signed)
Report to PACU, RN, vss, BBS= Clear.  

## 2014-06-14 NOTE — Patient Instructions (Addendum)
Only 2 small ulcers in the small intestine. I think overall things are good. No polyps or cancer.  Next routine colonoscopy in 10 years - 2025  I should see you in the office in 1 year, sooner if needed.  It is the time of year to have a vaccination to prevent the flu (influenza virus).  Please have this done through your primary care provider or you can get this done at local pharmacies or the [facility]. It would be very helpful if you notify your primary care provider when and where you had the vaccination given by messaging them in My Chart, leaving a message or faxing the information.  I appreciate the opportunity to care for you. Iva Booparl E. Gessner, MD, FACG    YOU HAD AN ENDOSCOPIC PROCEDURE TODAY AT THE [facility]: Refer to the procedure report that was given to you for any specific questions about what was found during the examination.  If the procedure report does not answer your questions, please call your gastroenterologist to clarify.  If you requested that your care partner not be given the details of your procedure findings, then the procedure report has been included in a sealed envelope for you to review at your convenience later.  YOU SHOULD EXPECT: Some feelings of bloating in the abdomen. Passage of more gas than usual.  Walking can help get rid of the air that was put into your GI tract during the procedure and reduce the bloating. If you had a lower endoscopy (such as a colonoscopy or flexible sigmoidoscopy) you may notice spotting of blood in your stool or on the toilet paper. If you underwent a bowel prep for your procedure, then you may not have a normal bowel movement for a few days.  DIET: Your first meal following the procedure should be a light meal and then it is ok to progress to your normal diet.  A half-sandwich or bowl of soup is an example of a good first meal.  Heavy or fried foods are harder to digest and may make you feel nauseous or  bloated.  Likewise meals heavy in dairy and vegetables can cause extra gas to form and this can also increase the bloating.  Drink plenty of fluids but you should avoid alcoholic beverages for 24 hours.  ACTIVITY: Your care partner should take you home directly after the procedure.  You should plan to take it easy, moving slowly for the rest of the day.  You can resume normal activity the day after the procedure however you should NOT DRIVE or use heavy machinery for 24 hours (because of the sedation medicines used during the test).    SYMPTOMS TO REPORT IMMEDIATELY: A gastroenterologist can be reached at any hour.  During normal business hours, 8:30 AM to 5:00 PM Monday through Friday, call [phone].  After hours and on weekends, please call the GI answering service at [phone] who will take a message and have the physician on call contact you.   Following lower endoscopy (colonoscopy or flexible sigmoidoscopy):  Excessive amounts of blood in the stool  Significant tenderness or worsening of abdominal pains  Swelling of the abdomen that is new, acute  Fever of 100F or higher   FOLLOW UP: If any biopsies were taken you will be contacted by phone or by letter within the next 1-3 weeks.  Call your gastroenterologist if you have not heard about the biopsies in 3 weeks.  Our staff will call  the home number listed on your records the next business day following your procedure to check on you and address any questions or concerns that you may have at that time regarding the information given to you following your procedure. This is a courtesy call and so if there is no answer at the home number and we have not heard from you through the emergency physician on call, we will assume that you have returned to your regular daily activities without incident.  SIGNATURES/CONFIDENTIALITY: You and/or your care partner have signed paperwork which will be entered into your electronic medical record.   These signatures attest to the fact that that the information above on your After Visit Summary has been reviewed and is understood.  Full responsibility of the confidentiality of this discharge information lies with you and/or your care-partner    .

## 2014-06-15 ENCOUNTER — Telehealth: Payer: Self-pay

## 2014-06-15 ENCOUNTER — Telehealth: Payer: Self-pay | Admitting: *Deleted

## 2014-06-15 NOTE — Telephone Encounter (Signed)
Left message for patient to call back  

## 2014-06-15 NOTE — Telephone Encounter (Signed)
-----   Message from Iva Booparl E Gessner, MD sent at 06/14/2014  3:08 PM EST ----- Regarding: flu vaccine Please arrange for her to get a flu vaccine Had colonoscopy today -

## 2014-06-15 NOTE — Telephone Encounter (Signed)
  Follow up Call-  Call back number 06/14/2014  Post procedure Call Back phone  # [phone]  Permission to leave phone message Yes     Patient questions:  Do you have a fever, pain , or abdominal swelling? No. Pain Score  0 *  Have you tolerated food without any problems? Yes.    Have you been able to return to your normal activities? Yes.    Do you have any questions about your discharge instructions: Diet   No. Medications  No. Follow up visit  No.  Do you have questions or concerns about your Care? No.  Actions: * If pain score is 4 or above: No action needed, pain <4.

## 2014-06-16 NOTE — Telephone Encounter (Signed)
I have scheduled her for a flu shot for tomorrow am at 9:00.  She is aware of the appt date and time

## 2014-06-17 ENCOUNTER — Ambulatory Visit: Payer: Medicaid Other | Admitting: Internal Medicine

## 2014-06-17 ENCOUNTER — Ambulatory Visit (INDEPENDENT_AMBULATORY_CARE_PROVIDER_SITE_OTHER): Payer: Medicaid Other | Admitting: *Deleted

## 2014-06-17 DIAGNOSIS — Z23 Encounter for immunization: Secondary | ICD-10-CM

## 2014-11-08 ENCOUNTER — Other Ambulatory Visit: Payer: Self-pay | Admitting: Internal Medicine

## 2014-11-15 ENCOUNTER — Telehealth: Payer: Self-pay | Admitting: Internal Medicine

## 2014-11-15 DIAGNOSIS — K50818 Crohn's disease of both small and large intestine with other complication: Secondary | ICD-10-CM

## 2014-11-15 NOTE — Telephone Encounter (Signed)
Patient reports she is doing well.  She will come for labs this week. She is aware she will need follow up in November unless she is having problems

## 2014-11-15 NOTE — Telephone Encounter (Signed)
New labs entered Left message for patient to call back  

## 2014-11-15 NOTE — Telephone Encounter (Signed)
Patient hasn't had labs since last September.  NO orders on the last labs.  When should she come for labs and does she need office visit?

## 2014-11-15 NOTE — Telephone Encounter (Signed)
1) CBC and LFT's now please 2) As long as she is ok - next REV 06/2015 (1 year from colonoscopy) sooner prn

## 2014-11-17 ENCOUNTER — Other Ambulatory Visit: Payer: Self-pay | Admitting: Internal Medicine

## 2014-11-18 ENCOUNTER — Telehealth: Payer: Self-pay | Admitting: Internal Medicine

## 2014-11-18 NOTE — Telephone Encounter (Signed)
Patient states she has a issue getting her Imuran at the pharmacy [facility]. She states that is what she has been paying but now they are trying to charge her over 100 dollars. She [facility] but has not got a return call yet. She wanted to know if we had some samples. Told patient we do not carry samples of that medication but to call our office back if we need to do anything different with her medicines. Pt verbalized understanding.

## 2014-11-30 ENCOUNTER — Other Ambulatory Visit (INDEPENDENT_AMBULATORY_CARE_PROVIDER_SITE_OTHER): Payer: Medicaid Other

## 2014-11-30 DIAGNOSIS — K50818 Crohn's disease of both small and large intestine with other complication: Secondary | ICD-10-CM

## 2014-11-30 LAB — CBC WITH DIFFERENTIAL/PLATELET
BASOS ABS: 0 10*3/uL (ref 0.0–0.1)
Basophils Relative: 0.5 % (ref 0.0–3.0)
EOS ABS: 0 10*3/uL (ref 0.0–0.7)
Eosinophils Relative: 0.3 % (ref 0.0–5.0)
HEMATOCRIT: 39.9 % (ref 36.0–46.0)
HEMOGLOBIN: 13.4 g/dL (ref 12.0–15.0)
LYMPHS ABS: 1.5 10*3/uL (ref 0.7–4.0)
Lymphocytes Relative: 32 % (ref 12.0–46.0)
MCHC: 33.7 g/dL (ref 30.0–36.0)
MCV: 94.7 fl (ref 78.0–100.0)
Monocytes Absolute: 0.3 10*3/uL (ref 0.1–1.0)
Monocytes Relative: 6.9 % (ref 3.0–12.0)
Neutro Abs: 2.8 10*3/uL (ref 1.4–7.7)
Neutrophils Relative %: 60.3 % (ref 43.0–77.0)
PLATELETS: 171 10*3/uL (ref 150.0–400.0)
RBC: 4.21 Mil/uL (ref 3.87–5.11)
RDW: 14.7 % (ref 11.5–15.5)
WBC: 4.7 10*3/uL (ref 4.0–10.5)

## 2014-11-30 LAB — HEPATIC FUNCTION PANEL
ALT: 17 U/L (ref 0–35)
AST: 23 U/L (ref 0–37)
Albumin: 4.6 g/dL (ref 3.5–5.2)
Alkaline Phosphatase: 83 U/L (ref 39–117)
Bilirubin, Direct: 0.1 mg/dL (ref 0.0–0.3)
Total Bilirubin: 0.4 mg/dL (ref 0.2–1.2)
Total Protein: 7.3 g/dL (ref 6.0–8.3)

## 2014-12-01 ENCOUNTER — Other Ambulatory Visit: Payer: Self-pay

## 2014-12-01 DIAGNOSIS — Z79899 Other long term (current) drug therapy: Secondary | ICD-10-CM

## 2014-12-01 DIAGNOSIS — Z7960 Long term (current) use of unspecified immunomodulators and immunosuppressants: Secondary | ICD-10-CM

## 2014-12-01 NOTE — Progress Notes (Signed)
Quick Note:  Labs ok Repeat same 4 months ______ 

## 2015-04-13 ENCOUNTER — Other Ambulatory Visit (INDEPENDENT_AMBULATORY_CARE_PROVIDER_SITE_OTHER): Payer: Self-pay

## 2015-04-13 DIAGNOSIS — Z79899 Other long term (current) drug therapy: Secondary | ICD-10-CM

## 2015-04-13 DIAGNOSIS — Z7960 Long term (current) use of unspecified immunomodulators and immunosuppressants: Secondary | ICD-10-CM

## 2015-04-13 LAB — HEPATIC FUNCTION PANEL
ALT: 26 U/L (ref 0–35)
AST: 21 U/L (ref 0–37)
Albumin: 4.2 g/dL (ref 3.5–5.2)
Alkaline Phosphatase: 78 U/L (ref 39–117)
BILIRUBIN DIRECT: 0.1 mg/dL (ref 0.0–0.3)
BILIRUBIN TOTAL: 0.4 mg/dL (ref 0.2–1.2)
Total Protein: 7.4 g/dL (ref 6.0–8.3)

## 2015-04-13 LAB — CBC WITH DIFFERENTIAL/PLATELET
Basophils Absolute: 0 10*3/uL (ref 0.0–0.1)
Basophils Relative: 0.4 % (ref 0.0–3.0)
EOS ABS: 0.1 10*3/uL (ref 0.0–0.7)
Eosinophils Relative: 0.8 % (ref 0.0–5.0)
HCT: 38.9 % (ref 36.0–46.0)
HEMOGLOBIN: 13 g/dL (ref 12.0–15.0)
LYMPHS ABS: 2.5 10*3/uL (ref 0.7–4.0)
Lymphocytes Relative: 35.4 % (ref 12.0–46.0)
MCHC: 33.4 g/dL (ref 30.0–36.0)
MCV: 95.3 fl (ref 78.0–100.0)
Monocytes Absolute: 0.5 10*3/uL (ref 0.1–1.0)
Monocytes Relative: 6.5 % (ref 3.0–12.0)
NEUTROS PCT: 56.9 % (ref 43.0–77.0)
Neutro Abs: 4 10*3/uL (ref 1.4–7.7)
Platelets: 158 10*3/uL (ref 150.0–400.0)
RBC: 4.08 Mil/uL (ref 3.87–5.11)
RDW: 13.9 % (ref 11.5–15.5)
WBC: 7 10*3/uL (ref 4.0–10.5)

## 2015-04-16 NOTE — Progress Notes (Signed)
Quick Note:  Labs fine Repeat same 4 months She needs to seem me before end of year Nov/Dec ______

## 2015-04-17 ENCOUNTER — Other Ambulatory Visit: Payer: Self-pay

## 2015-04-17 DIAGNOSIS — K50119 Crohn's disease of large intestine with unspecified complications: Secondary | ICD-10-CM

## 2015-06-27 ENCOUNTER — Encounter: Payer: Self-pay | Admitting: Internal Medicine

## 2015-06-27 ENCOUNTER — Ambulatory Visit (INDEPENDENT_AMBULATORY_CARE_PROVIDER_SITE_OTHER): Payer: Medicaid Other | Admitting: Internal Medicine

## 2015-06-27 VITALS — BP 94/60 | HR 80 | Ht 64.0 in | Wt 121.2 lb

## 2015-06-27 DIAGNOSIS — Z23 Encounter for immunization: Secondary | ICD-10-CM

## 2015-06-27 DIAGNOSIS — Z7960 Long term (current) use of unspecified immunomodulators and immunosuppressants: Secondary | ICD-10-CM

## 2015-06-27 DIAGNOSIS — Z79899 Other long term (current) drug therapy: Secondary | ICD-10-CM

## 2015-06-27 DIAGNOSIS — K5080 Crohn's disease of both small and large intestine without complications: Secondary | ICD-10-CM

## 2015-06-27 MED ORDER — AZATHIOPRINE 50 MG PO TABS: ORAL_TABLET | ORAL | Status: DC

## 2015-06-27 NOTE — Progress Notes (Signed)
   Subjective:    Patient ID: [name], female    DOB: 06/26/1966, 49 y.o.   MRN: [id number] Cc:  HPI Doing well but has not been on azathioprine - lost Medicaid and could not afford   Found a job at [employer] for a while  1 daughter working other is at [employer] Medications, allergies, past medical history, past surgical history, family history and social history are reviewed and updated in the EMR.  Review of Systems As above    Objective:   Physical Exam @BP  94/60 mmHg  Pulse 80  Ht 5\' 4"  (1.626 m)  Wt 121 lb 4 oz (54.999 kg)  BMI 20.80 kg/m2  LMP 04/07/2013@  General:  NAD Eyes:   anicteric Lungs:  clear Heart:: S1S2 no rubs, murmurs or gallops Abdomen:  soft and nontender, BS+ Ext:   no edema, cyanosis or clubbing     Assessment & Plan:  Crohn's disease of both small and large intestine without complication (HCC) - Plan: azaTHIOprine (IMURAN) 50 MG tablet  Long-term use of immunosuppressant medication  Needs flu shot - Plan: Flu Vaccine QUAD 36+ mos IM (Fluarix)  Doing well overall Rx azathioprine and restart CNC and LFT in 4 mos RTC summer 2017

## 2015-06-27 NOTE — Patient Instructions (Addendum)
   [facility]  Let me know if you cannot do it.  Today you have been given a flu vaccine.  Stop your omeprazole.  I want you to come back for labs in early March.  Will see you here again in summer 2017.   I appreciate the opportunity to care for you. Stan Headarl Crews Mccollam, MD, [facility]

## 2015-06-27 NOTE — Assessment & Plan Note (Signed)
Flu shot today 

## 2015-06-27 NOTE — Assessment & Plan Note (Signed)
Restart AZA RTC 9-12 months

## 2015-06-28 ENCOUNTER — Encounter: Payer: Self-pay | Admitting: Internal Medicine

## 2015-07-05 ENCOUNTER — Telehealth: Payer: Self-pay | Admitting: Internal Medicine

## 2015-07-05 NOTE — Telephone Encounter (Signed)
Spoke with patient and told her to call her insurance company to get them to tell her what they will pay for and to call us back with choices and I will get Dr Leone PayorGessner to tell me which one is best for her.

## 2015-07-11 NOTE — Telephone Encounter (Signed)
Attempted to call patient, I could hear her but she couldn't hear me.  She said I'll call you back on another phone, hopefully she will so I can get update on if she has called the insurance company.

## 2015-08-16 ENCOUNTER — Telehealth: Payer: Self-pay | Admitting: Internal Medicine

## 2015-08-16 NOTE — Telephone Encounter (Signed)
Patient phoned back in, see 08/16/2015 phone note.

## 2015-08-16 NOTE — Telephone Encounter (Signed)
Left message on her voice mail to call me back to discuss her medicine.

## 2015-08-16 NOTE — Telephone Encounter (Signed)
I spoke with patient and she has been working and unable to call us.  She called #[phone] and they have given her a list of numbers to call to see if she can get help with the cost of her meds.  She is going to try to reach some of these #'s on her next day off which is 08/24/15 and get back in touch with me.

## 2015-09-26 NOTE — Telephone Encounter (Signed)
Spoke with patient again, she said she's waiting on a call back from [employer] and will call me back as soon as she hears something about which meds they will cover.

## 2015-10-12 NOTE — Telephone Encounter (Signed)
Spoke with patient again and she said she has not heard back from [employer] insurance company as to which drugs they will cover.  She is off today and will hopefully reach out to them today and call me back.

## 2015-10-22 ENCOUNTER — Emergency Department (HOSPITAL_COMMUNITY)
Admission: EM | Admit: 2015-10-22 | Discharge: 2015-10-22 | Disposition: A | Discharge status: Home / Self Care | Payer: Medicaid Other | Attending: Emergency Medicine | Admitting: Emergency Medicine

## 2015-10-22 ENCOUNTER — Encounter (HOSPITAL_COMMUNITY): Payer: Self-pay | Admitting: Emergency Medicine

## 2015-10-22 ENCOUNTER — Emergency Department (HOSPITAL_COMMUNITY): Payer: Medicaid Other

## 2015-10-22 DIAGNOSIS — M1990 Unspecified osteoarthritis, unspecified site: Secondary | ICD-10-CM | POA: Insufficient documentation

## 2015-10-22 DIAGNOSIS — M25472 Effusion, left ankle: Secondary | ICD-10-CM

## 2015-10-22 DIAGNOSIS — F17210 Nicotine dependence, cigarettes, uncomplicated: Secondary | ICD-10-CM | POA: Insufficient documentation

## 2015-10-22 DIAGNOSIS — Z880 Allergy status to penicillin: Secondary | ICD-10-CM | POA: Insufficient documentation

## 2015-10-22 DIAGNOSIS — Z79899 Other long term (current) drug therapy: Secondary | ICD-10-CM | POA: Insufficient documentation

## 2015-10-22 DIAGNOSIS — R2242 Localized swelling, mass and lump, left lower limb: Secondary | ICD-10-CM | POA: Insufficient documentation

## 2015-10-22 DIAGNOSIS — Z8639 Personal history of other endocrine, nutritional and metabolic disease: Secondary | ICD-10-CM | POA: Insufficient documentation

## 2015-10-22 DIAGNOSIS — K5080 Crohn's disease of both small and large intestine without complications: Secondary | ICD-10-CM | POA: Insufficient documentation

## 2015-10-22 DIAGNOSIS — G8929 Other chronic pain: Secondary | ICD-10-CM | POA: Insufficient documentation

## 2015-10-22 MED ORDER — PREDNISONE 10 MG PO TABS: 40.0000 mg | ORAL_TABLET | Freq: Every day | ORAL | Status: DC

## 2015-10-22 NOTE — ED Provider Notes (Signed)
CSN: [id number]     Arrival date & time 10/22/15  1648 History   First MD Initiated Contact with Patient 10/22/15 1829     Chief Complaint  Patient presents with  . Ankle Pain     Patient is a 50 y.o. female presenting with ankle pain. The history is provided by the patient. No language interpreter was used.  Ankle Pain  [name] is a 50 y.o. female who presents to the Emergency Department complaining of ankle pain.  She reports left ankle pain that started yesterday. She awoke yesterday morning with pain on the left medial aspect of her ankle. She recalls no injuries to that area. There is some local redness that area and some swelling. Today the pain is worsened and she feels throbbing that is worse when she ambulates. She denies any fevers, chills, body aches. No prior similar symptoms. She has a history of Crohn's. Dental procedures or invasive procedures. No history of gout. Symptoms are moderate, constant, worsening.  Past Medical History  Diagnosis Date  . Osteoarthritis   . Crohn's disease of small and large intestines (HCC)   . Vitamin D deficiency   . GERD (gastroesophageal reflux disease)   . Chronic headaches    Past Surgical History  Procedure Laterality Date  . Tubal ligation    . Knee surgery Left   . Hand surgery Right   . Capsule endoscopy  06/26/2007    crohn's  . Colonoscopy  07/12/2005    ileo-colitis   Family History  Problem Relation Age of Onset  . Colon cancer Neg Hx   . Colitis Neg Hx   . Crohn's disease Neg Hx    Social History  Substance Use Topics  . Smoking status: Current Every Day Smoker    Types: Cigarettes  . Smokeless tobacco: Never Used     Comment: form given 07-07-13  . Alcohol Use: Yes     Comment: every once in a while   OB History    No data available     Review of Systems  All other systems reviewed and are negative.     Allergies  Other and Penicillins  Home Medications   Prior to Admission medications    Medication Sig Start Date End Date Taking? Authorizing Provider  azaTHIOprine (IMURAN) 50 MG tablet take 1 and 1/2 tablet by mouth once daily 06/27/15   Iva Boop, MD  predniSONE (DELTASONE) 10 MG tablet Take 4 tablets (40 mg total) by mouth daily. 10/22/15   Tilden Fossa, MD   BP 117/81 mmHg  Pulse 65  Temp(Src) 98 F (36.7 C) (Oral)  Resp 18  SpO2 100%  LMP 04/07/2013 Physical Exam  Constitutional: She is oriented to person, place, and time. She appears well-developed and well-nourished. No distress.  HENT:  Head: Normocephalic and atraumatic.  Cardiovascular: Normal rate and regular rhythm.   Pulmonary/Chest: Effort normal and breath sounds normal.  Musculoskeletal:  Left medial malleolus with overlying redness and slight swelling. The area is mildly tender to palpation with pain on dorsiflexion, plantar flexion. There is no palpable effusion. There is no exquisite tenderness and no warmth to the joints. 2+ DP pulses bilaterally.  Neurological: She is alert and oriented to person, place, and time.  Skin: Skin is warm and dry.  Psychiatric: She has a normal mood and affect. Her behavior is normal.  Nursing note and vitals reviewed.   ED Course  Procedures (including critical care time) Labs Review Labs Reviewed -  No data to display  Imaging Review Dg Ankle Complete Left  10/22/2015  CLINICAL DATA:  Left medial ankle pain , swelling and redness on skin x 2 days with no known injury EXAM: LEFT ANKLE COMPLETE - 3+ VIEW COMPARISON:  None. FINDINGS: There is no evidence of fracture, dislocation, or joint effusion. There is no evidence of arthropathy or other focal bone abnormality. Soft tissues are unremarkable. IMPRESSION: Negative. Electronically Signed   By: Esperanza Heiraymond  Rubner M.D.   On: 10/22/2015 18:16   I have personally reviewed and evaluated these images and lab results as part of my medical decision-making.   EKG Interpretation None      MDM   Final diagnoses:   Ankle swelling, left    Patient with history of Crohn's disease here for evaluation of left ankle swelling. Examination is not consistent with gouty or septic arthritis. Treating with steroids for probable inflammatory arthropathy with outpatient orthopedics follow-up and return precautions discussed.    Tilden FossaElizabeth Daley Mooradian, MD 10/23/15 [phone]

## 2015-10-22 NOTE — Discharge Instructions (Signed)

## 2015-10-22 NOTE — ED Notes (Signed)
Pt states she woke up on Saturday with a swollen right ankle, states she didn't do anything to injure it. Left ankle now very painful to walk on, swollen, red, and warm. Denies any hx of gout

## 2015-10-30 ENCOUNTER — Other Ambulatory Visit (HOSPITAL_COMMUNITY): Payer: Self-pay | Admitting: Orthopedic Surgery

## 2015-10-30 DIAGNOSIS — M7989 Other specified soft tissue disorders: Principal | ICD-10-CM

## 2015-10-30 DIAGNOSIS — M79662 Pain in left lower leg: Secondary | ICD-10-CM

## 2015-10-31 ENCOUNTER — Ambulatory Visit (HOSPITAL_COMMUNITY)
Admission: RE | Admit: 2015-10-31 | Discharge: 2015-10-31 | Disposition: A | Discharge status: Home / Self Care | Payer: Self-pay | Source: Ambulatory Visit | Attending: Orthopedic Surgery | Admitting: Orthopedic Surgery

## 2015-10-31 DIAGNOSIS — M7989 Other specified soft tissue disorders: Secondary | ICD-10-CM | POA: Insufficient documentation

## 2015-10-31 DIAGNOSIS — M79662 Pain in left lower leg: Secondary | ICD-10-CM

## 2015-10-31 DIAGNOSIS — M79605 Pain in left leg: Secondary | ICD-10-CM | POA: Insufficient documentation

## 2015-10-31 NOTE — Progress Notes (Signed)
VASCULAR LAB PRELIMINARY  PRELIMINARY  PRELIMINARY  PRELIMINARY  Left lower extremity venous duplex completed.    Preliminary report:  .Left:  No evidence of DVT, superficial thrombosis, or Baker's cyst.  Briyanna Billingham, RVS 10/31/2015, 11:26 AM

## 2015-11-16 NOTE — Telephone Encounter (Signed)
Left voice mail for patient to call me back with an update on what she's heard from her insurance company.

## 2015-11-21 NOTE — Telephone Encounter (Signed)
Called and left her a message to touch base and see if she has heard anything from her insurance company or if she just wants to make an appointment and discuss medicine options.

## 2015-12-13 NOTE — Telephone Encounter (Signed)
Left detailed message to call me to discuss meds/appointment to see us to discuss meds.

## 2016-01-02 NOTE — Telephone Encounter (Signed)
Left message to call and set up appointment.

## 2017-05-18 IMAGING — CR DG ANKLE COMPLETE 3+V*L*
3 series · 3 of 3 positions shown · non-contrast
Comparison: None.

CLINICAL DATA: Left medial ankle pain , swelling and redness on
skin x 2 days with no known injury

EXAM:
LEFT ANKLE COMPLETE - 3+ VIEW

[x ankle ap left]
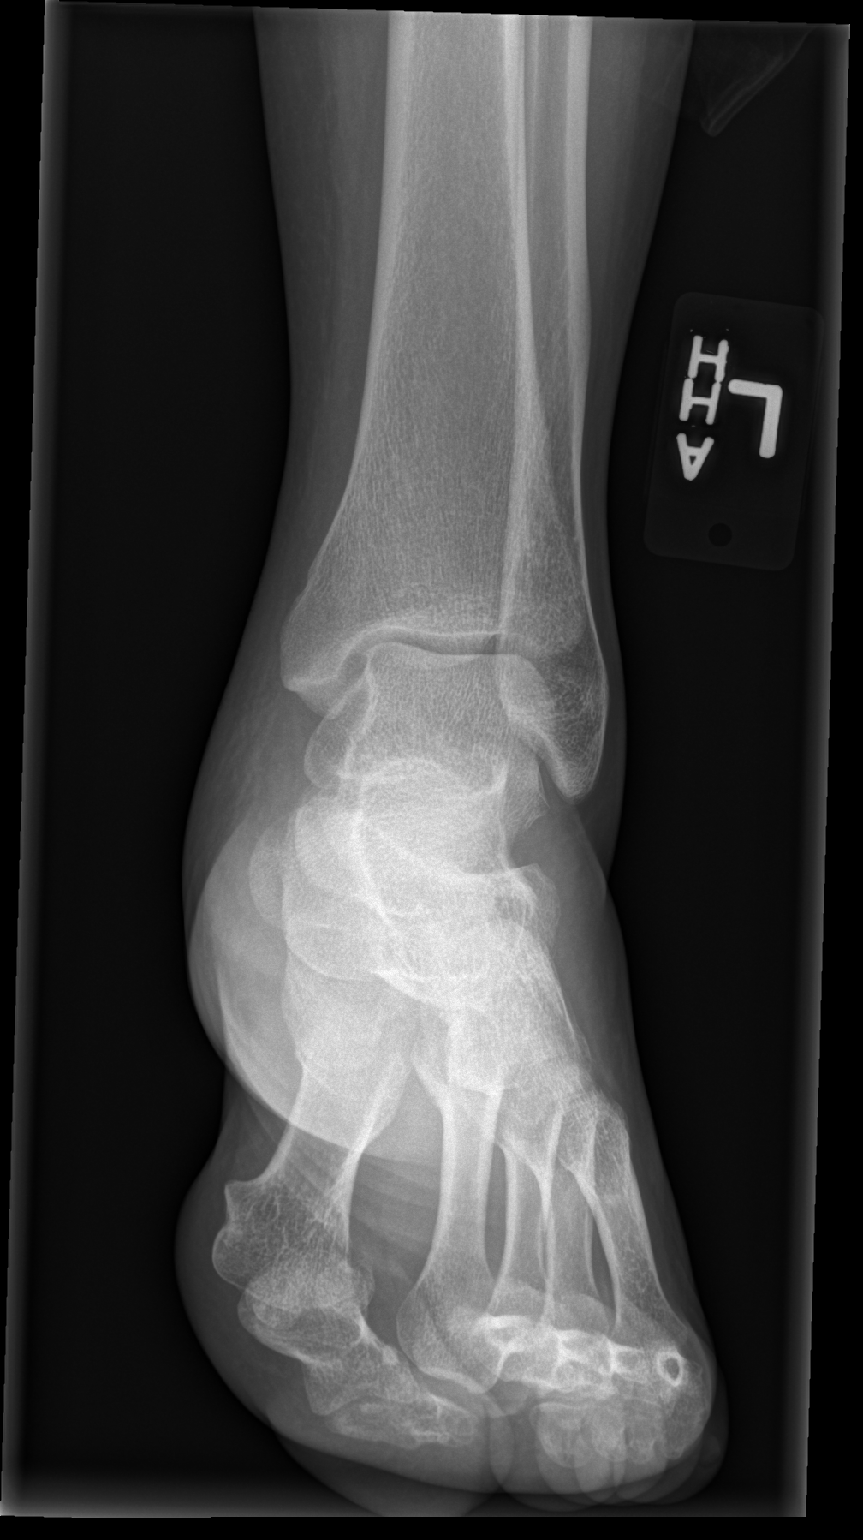

[x ankle obl left]
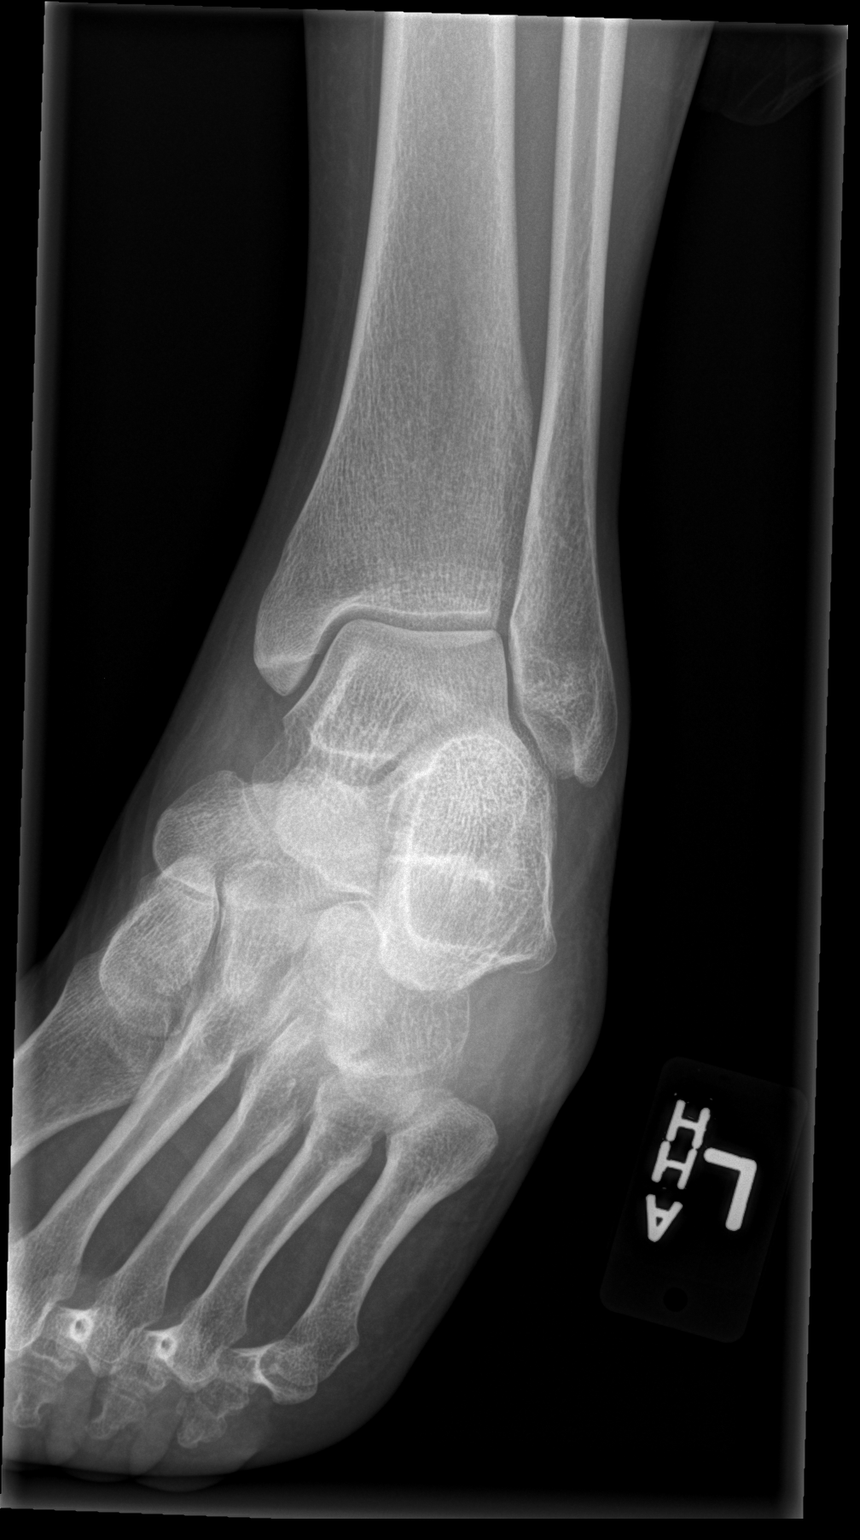

[x ankle lat left]
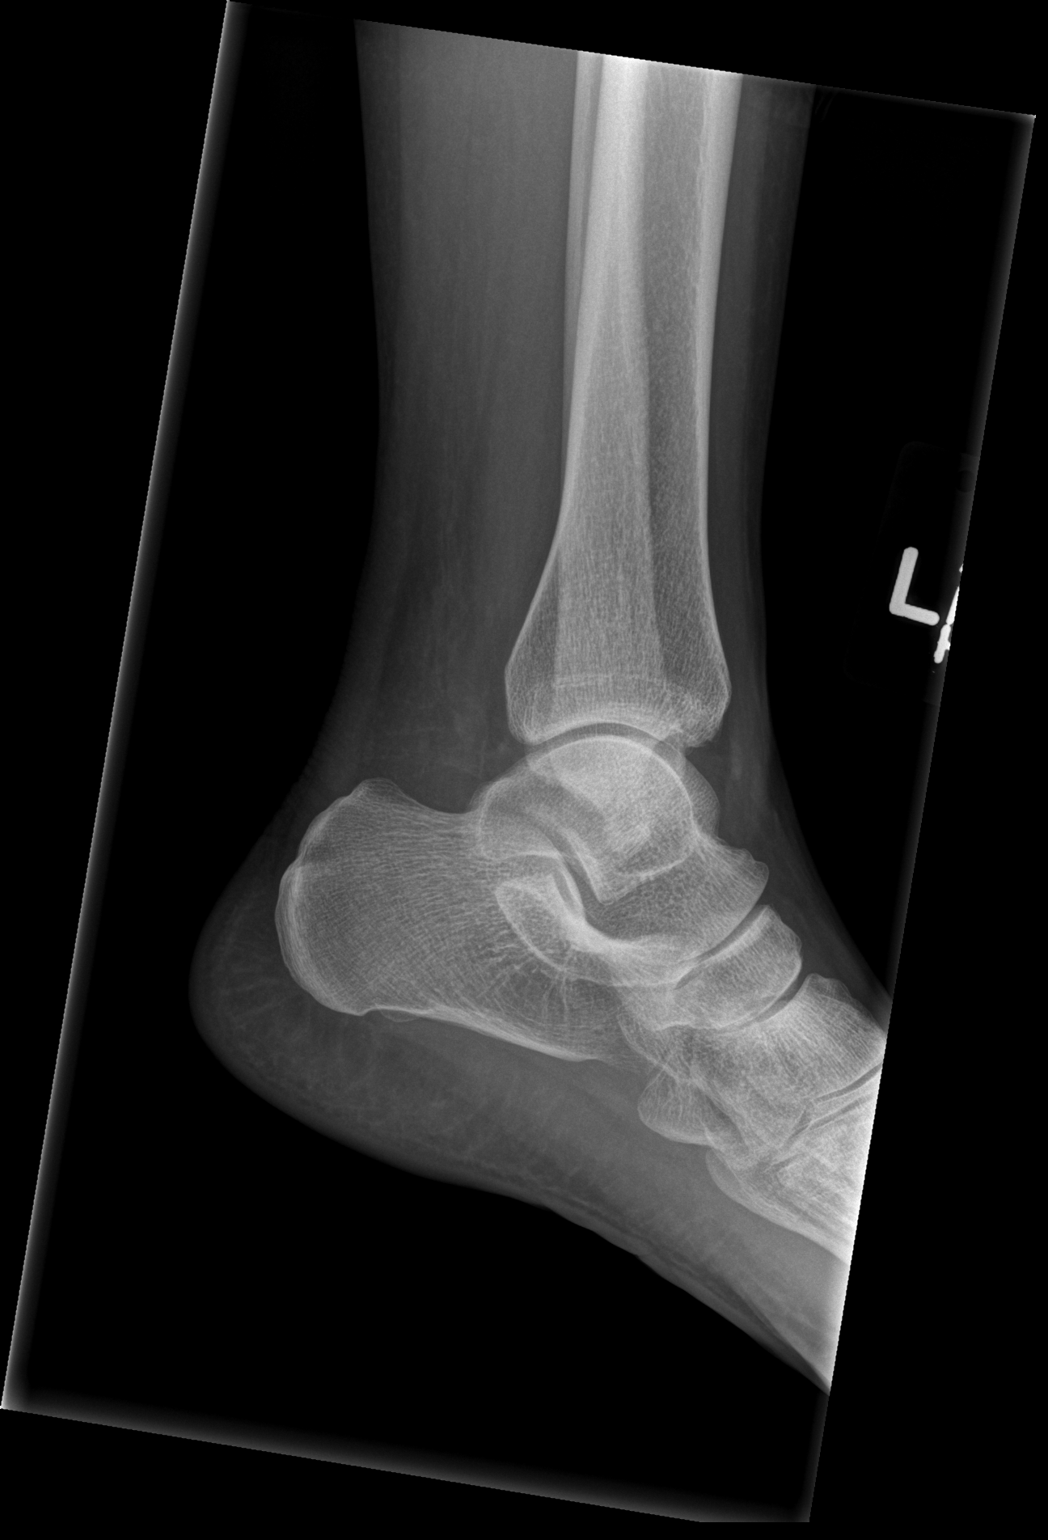

[3 of 3 positions shown; findings below may reference images not displayed]

FINDINGS: There is no evidence of fracture, dislocation, or joint effusion.
There is no evidence of arthropathy or other focal bone abnormality.
Soft tissues are unremarkable.
IMPRESSION: Negative.

## 2018-02-22 ENCOUNTER — Emergency Department (HOSPITAL_COMMUNITY): Payer: Self-pay

## 2018-02-22 ENCOUNTER — Other Ambulatory Visit: Payer: Self-pay

## 2018-02-22 ENCOUNTER — Emergency Department (HOSPITAL_COMMUNITY)
Admission: EM | Admit: 2018-02-22 | Discharge: 2018-02-22 | Disposition: A | Discharge status: Home / Self Care | Payer: Self-pay | Attending: Emergency Medicine | Admitting: Emergency Medicine

## 2018-02-22 ENCOUNTER — Encounter (HOSPITAL_COMMUNITY): Payer: Self-pay

## 2018-02-22 DIAGNOSIS — M79604 Pain in right leg: Secondary | ICD-10-CM | POA: Insufficient documentation

## 2018-02-22 DIAGNOSIS — Z79899 Other long term (current) drug therapy: Secondary | ICD-10-CM | POA: Insufficient documentation

## 2018-02-22 DIAGNOSIS — F17210 Nicotine dependence, cigarettes, uncomplicated: Secondary | ICD-10-CM | POA: Insufficient documentation

## 2018-02-22 MED ORDER — TRAMADOL HCL 50 MG PO TABS: 50.0000 mg | ORAL_TABLET | Freq: Four times a day (QID) | ORAL | 0 refills | Status: DC | PRN

## 2018-02-22 MED ORDER — NAPROXEN 500 MG PO TABS: 500.0000 mg | ORAL_TABLET | Freq: Two times a day (BID) | ORAL | 0 refills | Status: DC

## 2018-02-22 NOTE — ED Triage Notes (Signed)
Pt states right leg swelling and pain. Pt states she has been having issues with right knee for approx 1 year. Pt states when she puts weight on her leg she feels like she's going to fall. Pt reports difficulty bending her leg.

## 2018-02-22 NOTE — ED Provider Notes (Signed)
[facility]-EMERGENCY DEPT Provider Note   CSN: [id number] Arrival date & time: 02/22/18  [id number]     History   Chief Complaint Chief Complaint  Patient presents with  . Leg Pain    HPI [name] is a 52 y.o. female.  Patient complains of right knee pain for the last year off and on.  Mild swelling.  Patient did pass out from alcohol recently and has an abrasion to her right knee she is not sure if she fell on it  The history is provided by the patient. No language interpreter was used.  Leg Pain   This is a recurrent problem. The current episode started more than 2 days ago. The problem occurs constantly. The problem has not changed since onset.Pain location: Right knee. The quality of the pain is described as aching. The pain is at a severity of 6/10. The pain is moderate. Associated symptoms include stiffness. Exacerbated by: Movement. She has tried nothing for the symptoms.    Past Medical History:  Diagnosis Date  . Chronic headaches   . Crohn's disease of small and large intestines (HCC)   . GERD (gastroesophageal reflux disease)   . Osteoarthritis   . Vitamin D deficiency     Patient Active Problem List   Diagnosis Date Noted  . Smoker 08/09/2013  . Long-term use of immunosuppressant medication 09/17/2011  . HEADACHE, CHRONIC 02/01/2010  . CROHN'S DISEASE  SMALL AND LARGE INTESTINE 08/21/2009  . GERD 08/17/2007  . OSTEOARTHRITIS 07/08/2006    Past Surgical History:  Procedure Laterality Date  . CAPSULE ENDOSCOPY  06/26/2007   crohn's  . COLONOSCOPY  07/12/2005   ileo-colitis  . HAND SURGERY Right   . KNEE SURGERY Left   . TUBAL LIGATION       OB History   None      Home Medications    Prior to Admission medications   Medication Sig Start Date End Date Taking? Authorizing Provider  cholecalciferol (VITAMIN D) 1000 units tablet Take 1,000 Units by mouth daily.   Yes [provider]  azaTHIOprine (IMURAN) 50 MG  tablet take 1 and 1/2 tablet by mouth once daily 06/27/15   Iva BoopGessner, Carl E, MD  naproxen (NAPROSYN) 500 MG tablet Take 1 tablet (500 mg total) by mouth 2 (two) times daily. 02/22/18   Bethann BerkshireZammit, Akire Rennert, MD  predniSONE (DELTASONE) 10 MG tablet Take 4 tablets (40 mg total) by mouth daily. Patient not taking: Reported on 02/22/2018 10/22/15   Tilden Fossaees, Elizabeth, MD  traMADol (ULTRAM) 50 MG tablet Take 1 tablet (50 mg total) by mouth every 6 (six) hours as needed. 02/22/18   Bethann BerkshireZammit, Raiden Yearwood, MD    Family History Family History  Problem Relation Age of Onset  . Colon cancer Neg Hx   . Colitis Neg Hx   . Crohn's disease Neg Hx     Social History Social History   Tobacco Use  . Smoking status: Current Every Day Smoker    Packs/day: 0.50    Types: Cigarettes  . Smokeless tobacco: Never Used  . Tobacco comment: form given 07-07-13  Substance Use Topics  . Alcohol use: Yes    Comment: every once in a while  . Drug use: No     Allergies   Other and Penicillins   Review of Systems Review of Systems  Constitutional: Negative for appetite change and fatigue.  HENT: Negative for congestion, ear discharge and sinus pressure.   Eyes: Negative for discharge.  Respiratory: Negative for cough.   Cardiovascular: Negative for chest pain.  Gastrointestinal: Negative for abdominal pain and diarrhea.  Genitourinary: Negative for frequency and hematuria.  Musculoskeletal: Positive for stiffness. Negative for back pain.       Right knee pain  Skin: Negative for rash.  Neurological: Negative for seizures and headaches.  Psychiatric/Behavioral: Negative for hallucinations.     Physical Exam Updated Vital Signs BP (!) 117/100 (BP Location: Left Arm)   Pulse 73   Temp 97.7 F (36.5 C) (Oral)   Resp 14   Ht 5\' 4"  (1.626 m)   Wt 50.3 kg (111 lb)   LMP 04/07/2013   SpO2 100%   BMI 19.05 kg/m   Physical Exam  Constitutional: She is oriented to person, place, and time. She appears well-developed.   HENT:  Head: Normocephalic.  Eyes: Conjunctivae are normal.  Neck: No tracheal deviation present.  Cardiovascular:  No murmur heard. Musculoskeletal:  Abrasion to the lateral right knee.  Minimal swelling to the knee joint significant tenderness with flexion.  Neurological: She is oriented to person, place, and time.  Skin: Skin is warm.  Psychiatric: She has a normal mood and affect.     ED Treatments / Results  Labs (all labs ordered are listed, but only abnormal results are displayed) Labs Reviewed - No data to display  EKG None  Radiology Dg Knee Complete 4 Views Right  Result Date: 02/22/2018 CLINICAL DATA:  Knee pain and swelling for 1 year. EXAM: RIGHT KNEE - COMPLETE 4+ VIEW COMPARISON:  None. FINDINGS: The joint spaces are maintained. No acute bony findings or bone lesion. No joint effusion. IMPRESSION: Normal right knee radiographs. Electronically Signed   By: Rudie Meyer M.D.   On: 02/22/2018 10:31    Procedures Procedures (including critical care time)  Medications Ordered in ED Medications - No data to display   Initial Impression / Assessment and Plan / ED Course  I have reviewed the triage vital signs and the nursing notes.  Pertinent labs & imaging results that were available during my care of the patient were reviewed by me and considered in my medical decision making (see chart for details).     Patient with x-ray of right knee is unremarkable.  She has abrasions right knee and mild swelling and tenderness with decreased range of motion.  Suspect contusion to right knee with possible chronic pain.  Patient has a knee brace she is going to wear and she will use crutches.  She will be given Naprosyn and Ultram and will follow-up with orthopedic  Final Clinical Impressions(s) / ED Diagnoses   Final diagnoses:  Right leg pain    ED Discharge Orders        Ordered    naproxen (NAPROSYN) 500 MG tablet  2 times daily     02/22/18 1148    traMADol  (ULTRAM) 50 MG tablet  Every 6 hours PRN     02/22/18 1148       Bethann Berkshire, MD 02/22/18 1154

## 2018-02-22 NOTE — Discharge Instructions (Addendum)
Follow-up with Dr.XU either the end of this week beginning next week.  Keep your leg elevated and try to ambulate for the next couple days with just the crutches

## 2024-06-08 ENCOUNTER — Encounter: Payer: Self-pay | Admitting: Family Medicine

## 2024-06-08 ENCOUNTER — Ambulatory Visit: Admitting: Family Medicine

## 2024-06-08 VITALS — BP 148/75 | HR 96 | Ht 64.0 in | Wt 124.4 lb

## 2024-06-08 DIAGNOSIS — K50119 Crohn's disease of large intestine with unspecified complications: Secondary | ICD-10-CM | POA: Diagnosis not present

## 2024-06-08 DIAGNOSIS — Z7689 Persons encountering health services in other specified circumstances: Secondary | ICD-10-CM

## 2024-06-08 DIAGNOSIS — R030 Elevated blood-pressure reading, without diagnosis of hypertension: Secondary | ICD-10-CM | POA: Diagnosis not present

## 2024-06-08 DIAGNOSIS — F17210 Nicotine dependence, cigarettes, uncomplicated: Secondary | ICD-10-CM

## 2024-06-08 DIAGNOSIS — Z23 Encounter for immunization: Secondary | ICD-10-CM

## 2024-06-08 DIAGNOSIS — F17200 Nicotine dependence, unspecified, uncomplicated: Secondary | ICD-10-CM

## 2024-06-08 NOTE — Progress Notes (Unsigned)
 New Patient Office Visit  Subjective    Patient ID: [name], female    DOB: 1966/05/29  Age: 58 y.o. MRN: [id number]  CC:  Chief Complaint  Patient presents with   Establish Care    Pt reports spot on back that itches and sometimes hurts. Pt reports sometimes feeling dizzy and lightheaded    HPI Jalaila D Schwebke presents to establish care. Patient reports that she has Crohn's disease and has been having some symptoms. She has not been able to continue with the specialist because she had lost insurance.    Outpatient Encounter Medications as of 06/08/2024  Medication Sig   azaTHIOprine  (IMURAN ) 50 MG tablet take 1 and 1/2 tablet by mouth once daily (Patient not taking: Reported on 06/08/2024)   cholecalciferol (VITAMIN D ) 1000 units tablet Take 1,000 Units by mouth daily.   naproxen  (NAPROSYN ) 500 MG tablet Take 1 tablet (500 mg total) by mouth 2 (two) times daily.   predniSONE  (DELTASONE ) 10 MG tablet Take 4 tablets (40 mg total) by mouth daily. (Patient not taking: Reported on 02/22/2018)   traMADol  (ULTRAM ) 50 MG tablet Take 1 tablet (50 mg total) by mouth every 6 (six) hours as needed.   No facility-administered encounter medications on file as of 06/08/2024.    Past Medical History:  Diagnosis Date   Chronic headaches    Crohn's disease of small and large intestines (HCC)    GERD (gastroesophageal reflux disease)    Osteoarthritis    Vitamin D  deficiency     Past Surgical History:  Procedure Laterality Date   CAPSULE ENDOSCOPY  06/26/2007   crohn's   COLONOSCOPY  07/12/2005   ileo-colitis   HAND SURGERY Right    KNEE SURGERY Left    TUBAL LIGATION      Family History  Problem Relation Age of Onset   Colon cancer Neg Hx    Colitis Neg Hx    Crohn's disease Neg Hx     Social History   Socioeconomic History   Marital status: Single    Spouse name: Not on file   Number of children: Not on file   Years of education: Not on file   Highest education  level: Not on file  Occupational History   Not on file  Tobacco Use   Smoking status: Every Day    Current packs/day: 0.50    Types: Cigarettes   Smokeless tobacco: Never   Tobacco comments:    form given 07-07-13  Substance and Sexual Activity   Alcohol use: Yes    Comment: every once in a while   Drug use: No   Sexual activity: Not on file  Other Topics Concern   Not on file  Social History Narrative   Not on file   Social Drivers of Health   Financial Resource Strain: Medium Risk (06/08/2024)   Overall Financial Resource Strain (CARDIA)    Difficulty of Paying Living Expenses: Somewhat hard  Food Insecurity: Not on file  Transportation Needs: Not on file  Physical Activity: Inactive (06/08/2024)   Exercise Vital Sign    Days of Exercise per Week: 0 days    Minutes of Exercise per Session: 0 min  Stress: Stress Concern Present (06/08/2024)   [employer] of Occupational Health - Occupational Stress Questionnaire    Feeling of Stress: Rather much  Social Connections: Socially Isolated (06/08/2024)   Social Connection and Isolation Panel    Frequency of Communication with Friends and Family: More than  three times a week    Frequency of Social Gatherings with Friends and Family: More than three times a week    Attends Religious Services: Never    Database Administrator or Organizations: No    Attends Banker Meetings: Never    Marital Status: Never married  Intimate Partner Violence: Not on file    Review of Systems  Gastrointestinal:  Positive for diarrhea.  All other systems reviewed and are negative.       Objective   BP (!) 148/75   Pulse 96   Ht 5' 4 (1.626 m)   Wt 124 lb 6.4 oz (56.4 kg)   LMP 04/07/2013   SpO2 99%   BMI 21.35 kg/m   Physical Exam Vitals and nursing note reviewed.  Constitutional:      General: She is not in acute distress. Cardiovascular:     Rate and Rhythm: Normal rate and regular rhythm.  Pulmonary:      Effort: Pulmonary effort is normal.     Breath sounds: Normal breath sounds.  Abdominal:     Palpations: Abdomen is soft.     Tenderness: There is no abdominal tenderness.  Neurological:     General: No focal deficit present.     Mental Status: She is alert and oriented to person, place, and time.         Assessment & Plan:  1. Crohn's disease of colon with complication (HCC) (Primary) Referral for continued management - Ambulatory referral to Gastroenterology  2. Elevated blood pressure reading without diagnosis of hypertension   3. Smoker Discussed reduction/cessation  4. Encounter for immunization  - Flu vaccine trivalent PF, 6mos and older(Flulaval,Afluria,Fluarix,Fluzone)  5. Encounter to establish care    No follow-ups on file.   Tanda Raguel SQUIBB, MD

## 2024-06-09 ENCOUNTER — Encounter: Payer: Self-pay | Admitting: Family Medicine

## 2024-06-16 ENCOUNTER — Ambulatory Visit (INDEPENDENT_AMBULATORY_CARE_PROVIDER_SITE_OTHER): Admitting: Family Medicine

## 2024-06-16 ENCOUNTER — Encounter: Payer: Self-pay | Admitting: Family Medicine

## 2024-06-16 VITALS — BP 120/79 | HR 81 | Ht 64.0 in | Wt 126.6 lb

## 2024-06-16 DIAGNOSIS — Z136 Encounter for screening for cardiovascular disorders: Secondary | ICD-10-CM | POA: Diagnosis not present

## 2024-06-16 DIAGNOSIS — Z1211 Encounter for screening for malignant neoplasm of colon: Secondary | ICD-10-CM

## 2024-06-16 DIAGNOSIS — Z130 Encounter for screening for diseases of the blood and blood-forming organs and certain disorders involving the immune mechanism: Secondary | ICD-10-CM

## 2024-06-16 DIAGNOSIS — Z0000 Encounter for general adult medical examination without abnormal findings: Secondary | ICD-10-CM | POA: Diagnosis not present

## 2024-06-16 DIAGNOSIS — Z1231 Encounter for screening mammogram for malignant neoplasm of breast: Secondary | ICD-10-CM

## 2024-06-16 DIAGNOSIS — Z1329 Encounter for screening for other suspected endocrine disorder: Secondary | ICD-10-CM

## 2024-06-16 DIAGNOSIS — Z13228 Encounter for screening for other metabolic disorders: Secondary | ICD-10-CM

## 2024-06-17 LAB — COMPREHENSIVE METABOLIC PANEL WITH GFR
ALT: 23 IU/L (ref 0–32)
AST: 20 IU/L (ref 0–40)
Albumin: 4.3 g/dL (ref 3.8–4.9)
Alkaline Phosphatase: 78 IU/L (ref 49–135)
BUN/Creatinine Ratio: 19 (ref 9–23)
BUN: 14 mg/dL (ref 6–24)
Bilirubin Total: 0.3 mg/dL (ref 0.0–1.2)
CO2: 23 mmol/L (ref 20–29)
Calcium: 9.9 mg/dL (ref 8.7–10.2)
Chloride: 105 mmol/L (ref 96–106)
Creatinine, Ser: 0.72 mg/dL (ref 0.57–1.00)
Globulin, Total: 2.7 g/dL (ref 1.5–4.5)
Glucose: 104 mg/dL — ABNORMAL HIGH (ref 70–99)
Potassium: 4.5 mmol/L (ref 3.5–5.2)
Sodium: 143 mmol/L (ref 134–144)
Total Protein: 7 g/dL (ref 6.0–8.5)
eGFR: 98 mL/min/1.73 (ref >59)

## 2024-06-17 LAB — CBC WITH DIFFERENTIAL/PLATELET
Basophils Absolute: 0 x10E3/uL (ref 0.0–0.2)
Basos: 1 %
EOS (ABSOLUTE): 0 x10E3/uL (ref 0.0–0.4)
Eos: 1 %
Hematocrit: 41.3 % (ref 34.0–46.6)
Hemoglobin: 13.5 g/dL (ref 11.1–15.9)
Immature Grans (Abs): 0 x10E3/uL (ref 0.0–0.1)
Immature Granulocytes: 0 %
Lymphocytes Absolute: 2.2 x10E3/uL (ref 0.7–3.1)
Lymphs: 33 %
MCH: 32.8 pg (ref 26.6–33.0)
MCHC: 32.7 g/dL (ref 31.5–35.7)
MCV: 100 fL — ABNORMAL HIGH (ref 79–97)
Monocytes Absolute: 0.4 x10E3/uL (ref 0.1–0.9)
Monocytes: 6 %
Neutrophils Absolute: 3.9 x10E3/uL (ref 1.4–7.0)
Neutrophils: 59 %
Platelets: 214 x10E3/uL (ref 150–450)
RBC: 4.12 x10E6/uL (ref 3.77–5.28)
RDW: 13.7 % (ref 11.7–15.4)
WBC: 6.6 x10E3/uL (ref 3.4–10.8)

## 2024-06-17 LAB — LIPID PANEL
Chol/HDL Ratio: 4 ratio (ref 0.0–4.4)
Cholesterol, Total: 244 mg/dL — ABNORMAL HIGH (ref 100–199)
HDL: 61 mg/dL (ref >39)
LDL Chol Calc (NIH): 157 mg/dL — ABNORMAL HIGH (ref 0–99)
Triglycerides: 148 mg/dL (ref 0–149)
VLDL Cholesterol Cal: 26 mg/dL (ref 5–40)

## 2024-06-17 LAB — HEMOGLOBIN A1C
Est. average glucose Bld gHb Est-mCnc: 126 mg/dL
Hgb A1c MFr Bld: 6 % — ABNORMAL HIGH (ref 4.8–5.6)

## 2024-06-17 LAB — VITAMIN D 25 HYDROXY (VIT D DEFICIENCY, FRACTURES): Vit D, 25-Hydroxy: 10.4 ng/mL — ABNORMAL LOW (ref 30.0–100.0)

## 2024-06-21 ENCOUNTER — Encounter: Payer: Self-pay | Admitting: Family Medicine

## 2024-06-21 ENCOUNTER — Ambulatory Visit: Payer: Self-pay | Admitting: Family Medicine

## 2024-06-21 MED ORDER — VITAMIN D (ERGOCALCIFEROL) 1.25 MG (50000 UNIT) PO CAPS: 50000.0000 [IU] | ORAL_CAPSULE | ORAL | 0 refills | Status: AC

## 2024-06-21 NOTE — Progress Notes (Signed)
 Established Patient Office Visit  Subjective    Patient ID: [name], female    DOB: March 05, 1966  Age: 58 y.o. MRN: [id number]  CC:  Chief Complaint  Patient presents with   Annual Exam    HPI [name] presents for routine annual exam. Patient denies acute complaints or concerns.   No outpatient encounter medications on file as of 06/16/2024.   No facility-administered encounter medications on file as of 06/16/2024.    Past Medical History:  Diagnosis Date   Chronic headaches    Crohn's disease of small and large intestines (HCC)    GERD (gastroesophageal reflux disease)    Osteoarthritis    Vitamin D  deficiency     Past Surgical History:  Procedure Laterality Date   CAPSULE ENDOSCOPY  06/26/2007   crohn's   COLONOSCOPY  07/12/2005   ileo-colitis   HAND SURGERY Right    KNEE SURGERY Left    TUBAL LIGATION      Family History  Problem Relation Age of Onset   Colon cancer Neg Hx    Colitis Neg Hx    Crohn's disease Neg Hx     Social History   Socioeconomic History   Marital status: Single    Spouse name: Not on file   Number of children: Not on file   Years of education: Not on file   Highest education level: Not on file  Occupational History   Not on file  Tobacco Use   Smoking status: Every Day    Current packs/day: 0.50    Types: Cigarettes   Smokeless tobacco: Never   Tobacco comments:    form given 07-07-13  Substance and Sexual Activity   Alcohol use: Yes    Comment: every once in a while   Drug use: No   Sexual activity: Not on file  Other Topics Concern   Not on file  Social History Narrative   Not on file   Social Drivers of Health   Financial Resource Strain: Medium Risk (06/08/2024)   Overall Financial Resource Strain (CARDIA)    Difficulty of Paying Living Expenses: Somewhat hard  Food Insecurity: No Food Insecurity (06/08/2024)   Hunger Vital Sign    Worried About Running Out of Food in the Last Year: Never true     Ran Out of Food in the Last Year: Never true  Transportation Needs: No Transportation Needs (06/08/2024)   PRAPARE - Administrator, Civil Service (Medical): No    Lack of Transportation (Non-Medical): No  Physical Activity: Inactive (06/08/2024)   Exercise Vital Sign    Days of Exercise per Week: 0 days    Minutes of Exercise per Session: 0 min  Stress: Stress Concern Present (06/08/2024)   [employer] of Occupational Health - Occupational Stress Questionnaire    Feeling of Stress: Rather much  Social Connections: Socially Isolated (06/08/2024)   Social Connection and Isolation Panel    Frequency of Communication with Friends and Family: More than three times a week    Frequency of Social Gatherings with Friends and Family: More than three times a week    Attends Religious Services: Never    Database Administrator or Organizations: No    Attends Banker Meetings: Never    Marital Status: Never married  Intimate Partner Violence: Not At Risk (06/08/2024)   Humiliation, Afraid, Rape, and Kick questionnaire    Fear of Current or Ex-Partner: No    Emotionally Abused:  No    Physically Abused: No    Sexually Abused: No    Review of Systems  All other systems reviewed and are negative.       Objective    BP 120/79   Pulse 81   Ht 5' 4 (1.626 m)   Wt 126 lb 9.6 oz (57.4 kg)   LMP 04/07/2013   SpO2 99%   BMI 21.73 kg/m   Physical Exam Vitals and nursing note reviewed.  Constitutional:      General: She is not in acute distress. HENT:     Head: Normocephalic and atraumatic.     Right Ear: Tympanic membrane, ear canal and external ear normal.     Left Ear: Tympanic membrane, ear canal and external ear normal.     Nose: Nose normal.     Mouth/Throat:     Mouth: Mucous membranes are moist.     Pharynx: Oropharynx is clear.  Eyes:     Conjunctiva/sclera: Conjunctivae normal.     Pupils: Pupils are equal, round, and reactive to light.  Neck:      Thyroid: No thyromegaly.  Cardiovascular:     Rate and Rhythm: Normal rate and regular rhythm.     Heart sounds: Normal heart sounds. No murmur heard. Pulmonary:     Effort: Pulmonary effort is normal. No respiratory distress.     Breath sounds: Normal breath sounds.  Abdominal:     General: There is no distension.     Palpations: Abdomen is soft. There is no mass.     Tenderness: There is no abdominal tenderness.  Musculoskeletal:        General: Normal range of motion.     Cervical back: Normal range of motion and neck supple.  Skin:    General: Skin is warm and dry.  Neurological:     General: No focal deficit present.     Mental Status: She is alert and oriented to person, place, and time.  Psychiatric:        Mood and Affect: Mood normal.        Behavior: Behavior normal.         Assessment & Plan:   Annual physical exam -     Comprehensive metabolic panel with GFR  Screening for deficiency anemia -     CBC with Differential/Platelet  Encounter for screening for cardiovascular disorders -     Lipid panel  Screening for endocrine/metabolic/immunity disorders -     Hemoglobin A1c -     VITAMIN D  25 Hydroxy (Vit-D Deficiency, Fractures)  Encounter for screening mammogram for malignant neoplasm of breast -     3D Screening Mammogram, Left and Right; Future  Screening for colon cancer -     Cologuard     No follow-ups on file.   Tanda Raguel SQUIBB, MD

## 2024-07-07 LAB — COLOGUARD: COLOGUARD: NEGATIVE

## 2024-07-20 ENCOUNTER — Ambulatory Visit
Admission: RE | Admit: 2024-07-20 | Discharge: 2024-07-20 | Disposition: A | Source: Ambulatory Visit | Attending: Family Medicine | Admitting: Family Medicine

## 2024-07-20 DIAGNOSIS — Z1231 Encounter for screening mammogram for malignant neoplasm of breast: Secondary | ICD-10-CM

## 2024-08-18 ENCOUNTER — Encounter: Payer: Self-pay | Admitting: Internal Medicine

## 2024-10-19 ENCOUNTER — Ambulatory Visit: Admitting: Internal Medicine
